# Patient Record
Sex: Male | Born: 1970 | Race: White | Hispanic: No | Marital: Married | Smoking: Former smoker
Health system: Southern US, Community
[De-identification: ages and names within clinical notes are randomized; demographics above are authoritative.]

## PROBLEM LIST (undated history)

## (undated) DIAGNOSIS — K219 Gastro-esophageal reflux disease without esophagitis: Secondary | ICD-10-CM

## (undated) DIAGNOSIS — K08109 Complete loss of teeth, unspecified cause, unspecified class: Secondary | ICD-10-CM

## (undated) DIAGNOSIS — J45909 Unspecified asthma, uncomplicated: Secondary | ICD-10-CM

## (undated) DIAGNOSIS — S68119A Complete traumatic metacarpophalangeal amputation of unspecified finger, initial encounter: Secondary | ICD-10-CM

## (undated) DIAGNOSIS — M5412 Radiculopathy, cervical region: Secondary | ICD-10-CM

## (undated) DIAGNOSIS — Z972 Presence of dental prosthetic device (complete) (partial): Secondary | ICD-10-CM

## (undated) DIAGNOSIS — M5020 Other cervical disc displacement, unspecified cervical region: Secondary | ICD-10-CM

## (undated) DIAGNOSIS — M1990 Unspecified osteoarthritis, unspecified site: Secondary | ICD-10-CM

## (undated) DIAGNOSIS — E119 Type 2 diabetes mellitus without complications: Secondary | ICD-10-CM

## (undated) HISTORY — DX: Complete loss of teeth, unspecified cause, unspecified class: K08.109

## (undated) HISTORY — DX: Gastro-esophageal reflux disease without esophagitis: K21.9

## (undated) HISTORY — PX: VASECTOMY: SHX75

## (undated) HISTORY — PX: TONSILLECTOMY: SUR1361

## (undated) HISTORY — PX: FINGER SURGERY: SHX640

---

## 2016-12-22 ENCOUNTER — Ambulatory Visit
Admission: EM | Admit: 2016-12-22 | Discharge: 2016-12-22 | Disposition: A | Discharge status: Home / Self Care | Payer: Self-pay | Attending: Emergency Medicine | Admitting: Emergency Medicine

## 2016-12-22 DIAGNOSIS — Z79899 Other long term (current) drug therapy: Secondary | ICD-10-CM | POA: Insufficient documentation

## 2016-12-22 DIAGNOSIS — S40262A Insect bite (nonvenomous) of left shoulder, initial encounter: Secondary | ICD-10-CM | POA: Insufficient documentation

## 2016-12-22 DIAGNOSIS — E876 Hypokalemia: Secondary | ICD-10-CM | POA: Insufficient documentation

## 2016-12-22 DIAGNOSIS — W57XXXA Bitten or stung by nonvenomous insect and other nonvenomous arthropods, initial encounter: Secondary | ICD-10-CM | POA: Insufficient documentation

## 2016-12-22 DIAGNOSIS — E1165 Type 2 diabetes mellitus with hyperglycemia: Secondary | ICD-10-CM | POA: Insufficient documentation

## 2016-12-22 DIAGNOSIS — J45909 Unspecified asthma, uncomplicated: Secondary | ICD-10-CM | POA: Insufficient documentation

## 2016-12-22 DIAGNOSIS — Z880 Allergy status to penicillin: Secondary | ICD-10-CM | POA: Insufficient documentation

## 2016-12-22 DIAGNOSIS — Z7984 Long term (current) use of oral hypoglycemic drugs: Secondary | ICD-10-CM | POA: Insufficient documentation

## 2016-12-22 DIAGNOSIS — R739 Hyperglycemia, unspecified: Secondary | ICD-10-CM

## 2016-12-22 DIAGNOSIS — R404 Transient alteration of awareness: Secondary | ICD-10-CM

## 2016-12-22 DIAGNOSIS — R748 Abnormal levels of other serum enzymes: Secondary | ICD-10-CM | POA: Insufficient documentation

## 2016-12-22 DIAGNOSIS — R4182 Altered mental status, unspecified: Secondary | ICD-10-CM | POA: Insufficient documentation

## 2016-12-22 HISTORY — DX: Unspecified asthma, uncomplicated: J45.909

## 2016-12-22 LAB — URINALYSIS, COMPLETE (UACMP) WITH MICROSCOPIC
BILIRUBIN URINE: NEGATIVE
Bacteria, UA: NONE SEEN
Ketones, ur: 40 mg/dL — AB
Leukocytes, UA: NEGATIVE
NITRITE: NEGATIVE
PROTEIN: 30 mg/dL — AB
Specific Gravity, Urine: 1.015 (ref 1.005–1.030)
pH: 5.5 (ref 5.0–8.0)

## 2016-12-22 LAB — CBC WITH DIFFERENTIAL/PLATELET
BASOS ABS: 0 10*3/uL (ref 0–0.1)
BASOS PCT: 1 %
EOS ABS: 0.1 10*3/uL (ref 0–0.7)
EOS PCT: 2 %
HCT: 42.3 % (ref 40.0–52.0)
HEMOGLOBIN: 13.7 g/dL (ref 13.0–18.0)
LYMPHS ABS: 2 10*3/uL (ref 1.0–3.6)
Lymphocytes Relative: 36 %
MCH: 25.6 pg — ABNORMAL LOW (ref 26.0–34.0)
MCHC: 32.3 g/dL (ref 32.0–36.0)
MCV: 79.3 fL — ABNORMAL LOW (ref 80.0–100.0)
Monocytes Absolute: 0.4 10*3/uL (ref 0.2–1.0)
Monocytes Relative: 7 %
NEUTROS PCT: 54 %
Neutro Abs: 3 10*3/uL (ref 1.4–6.5)
PLATELETS: 221 10*3/uL (ref 150–440)
RBC: 5.33 MIL/uL (ref 4.40–5.90)
RDW: 15.3 % — ABNORMAL HIGH (ref 11.5–14.5)
WBC: 5.5 10*3/uL (ref 3.8–10.6)

## 2016-12-22 LAB — COMPREHENSIVE METABOLIC PANEL
ALT: 103 U/L — AB (ref 17–63)
AST: 198 U/L — ABNORMAL HIGH (ref 15–41)
Albumin: 4.2 g/dL (ref 3.5–5.0)
Alkaline Phosphatase: 109 U/L (ref 38–126)
Anion gap: 13 (ref 5–15)
BILIRUBIN TOTAL: 1.1 mg/dL (ref 0.3–1.2)
BUN: 9 mg/dL (ref 6–20)
CALCIUM: 9.2 mg/dL (ref 8.9–10.3)
CHLORIDE: 96 mmol/L — AB (ref 101–111)
CO2: 22 mmol/L (ref 22–32)
CREATININE: 0.73 mg/dL (ref 0.61–1.24)
Glucose, Bld: 327 mg/dL — ABNORMAL HIGH (ref 65–99)
Potassium: 3 mmol/L — ABNORMAL LOW (ref 3.5–5.1)
Sodium: 131 mmol/L — ABNORMAL LOW (ref 135–145)
Total Protein: 7.9 g/dL (ref 6.5–8.1)

## 2016-12-22 LAB — GLUCOSE, CAPILLARY: GLUCOSE-CAPILLARY: 322 mg/dL — AB (ref 65–99)

## 2016-12-22 MED ORDER — DOXYCYCLINE HYCLATE 100 MG PO CAPS
100.0000 mg | ORAL_CAPSULE | Freq: Two times a day (BID) | ORAL | 0 refills | Status: DC
Start: 2016-12-22 — End: 2016-12-31

## 2016-12-22 MED ORDER — SODIUM CHLORIDE 0.9 % IV BOLUS (SEPSIS)
1000.0000 mL | Freq: Once | INTRAVENOUS | Status: AC
  Administered 2016-12-22: 1000 mL via INTRAVENOUS

## 2016-12-22 MED ORDER — METFORMIN HCL 500 MG PO TABS: 500.0000 mg | ORAL_TABLET | Freq: Every day | ORAL | 0 refills | Status: DC

## 2016-12-22 MED ORDER — POTASSIUM CHLORIDE CRYS ER 20 MEQ PO TBCR
40.0000 meq | EXTENDED_RELEASE_TABLET | Freq: Once | ORAL | Status: AC
  Administered 2016-12-22: 40 meq via ORAL

## 2016-12-22 NOTE — ED Provider Notes (Signed)
HPI  SUBJECTIVE:  Trevor Alvarado is a 46 y.o. male who was brought in by his wife for altered mental status described as "talking out of his head" and crying, being emotional. Patient had been working outside all day, and had not been drinking any fluids. He did eat breakfast this morning. Coworkers noted that the patient was not "acting himself" and so called his wife. She states that he was not acting like himself, but he was coherent and able to provide directions. He reports being dizzy, lightheaded, and generalized weakness. States that he had a shot of liquor earlier today due to stress, denies drinking more than that His wife gave him some Gatorade with improvement in symptoms. There are no aggravating factors. Patient denies presyncope, syncope, slurred speech, or leg weakness, facial droop, chest pain, shortness of breath, palpitations. No abdominal pain, urinary complaints. No polydipsia, polyuria, unintentional weight loss. He also reports removing an attached tick on his left shoulder one week ago. He is not sure if it was engorged. He now has a nontender, nonpainful not pruritic or burning rash in the area. No fevers, body aches, flulike symptoms, arthralgias, headaches, neck stiffness. Questionable mild photophobia. He has been putting antibiotic ointment on this. There are no other aggravating or alleviating factors. He has a past medical history of asthma, environmental allergies. No history of stroke mitral fibrillation, coronary disease, MI, diabetes, hypertension. Denies illicits or heavy alcohol use. Wife states that he has a shot or tw occasionally when under significant amount of stress, but states that he is not a regular heavy drinker. No regular medicines. Family history positive for a sister with a stroke at age 46. PMD: [facility].    Past Medical History:  Diagnosis Date  . Asthma     Past Surgical History:  Procedure Laterality Date  . TONSILLECTOMY      History  reviewed. No pertinent family history.  Social History  Substance Use Topics  . Smoking status: Never Smoker  . Smokeless tobacco: Current User    Types: Snuff  . Alcohol use No    No current facility-administered medications for this encounter.   Current Outpatient Prescriptions:  .  doxycycline (VIBRAMYCIN) 100 MG capsule, Take 1 capsule (100 mg total) by mouth 2 (two) times daily., Disp: 28 capsule, Rfl: 0 .  metFORMIN (GLUCOPHAGE) 500 MG tablet, Take 1 tablet (500 mg total) by mouth daily with breakfast. 1 tab po daily with the largest meal for the first week, then increase to 1 tab bid., Disp: 30 tablet, Rfl: 0  Allergies  Allergen Reactions  . Penicillins      ROS  As noted in HPI.   Physical Exam  BP (!) 156/111 (BP Location: Left Arm)   Pulse 88   Temp 97.5 F (36.4 C) (Oral)   Wt 250 lb (113.4 kg)   SpO2 100%   Orthostatic VS for the past 24 hrs:  BP- Lying Pulse- Lying BP- Sitting Pulse- Sitting BP- Standing at 0 minutes Pulse- Standing at 0 minutes  12/22/16 1530 143/73 73 155/79 75 162/89 79    Constitutional: Well developed, well nourished, no acute distress Eyes: PERRL, EOMI, conjunctiva normal bilaterally HENT: Normocephalic, atraumatic,mucus membranes moist Respiratory: Clear to auscultation bilaterally, no rales, no wheezing, no rhonchi Cardiovascular: Normal rate and rhythm, no murmurs, no gallops, no rubs GI: Soft, nondistended, normal bowel sounds, nontender, no rebound, no guarding Back: no CVAT Skin 9 x 13 nontender, blanchable annular rash on left shoulder. see picture  Musculoskeletal: Calves symmetric, nontender,  No edema, no tenderness, no deformities Neurologic: Alert & oriented x 3, CN II-XII intact, grip strength equal. No pronator drift for the upper or lower extremities, no motor deficits, sensation grossly intact Psychiatric: Speech and behavior appropriate   ED Course   Medications  sodium chloride 0.9 % bolus 1,000 mL  (0 mLs Intravenous Stopped 12/22/16 1610)  potassium chloride SA (K-DUR,KLOR-CON) CR tablet 40 mEq (40 mEq Oral Given 12/22/16 1525)    Orders Placed This Encounter  Procedures  . Glucose, capillary    Standing Status:   Standing    Number of Occurrences:   1  . Comprehensive metabolic panel    Standing Status:   Standing    Number of Occurrences:   1  . CBC with Differential    Standing Status:   Standing    Number of Occurrences:   1  . [place] spotted fvr abs pnl(IgG+IgM)    Standing Status:   Standing    Number of Occurrences:   1  . B. burgdorfi antibodies    Standing Status:   Standing    Number of Occurrences:   1  . Urinalysis, Complete w Microscopic    Standing Status:   Standing    Number of Occurrences:   1  . Hemoglobin A1c    Standing Status:   Standing    Number of Occurrences:   1  . Orthostatic vital signs    Standing Status:   Standing    Number of Occurrences:   1  . CBG monitoring, ED    Standing Status:   Standing    Number of Occurrences:   1  . ED EKG    Standing Status:   Standing    Number of Occurrences:   1    Order Specific Question:   Reason for Exam    Answer:   Weakness  . EKG 12-Lead    Standing Status:   Standing    Number of Occurrences:   1  . Insert peripheral IV    Standing Status:   Standing    Number of Occurrences:   1   Results for orders placed or performed during the hospital encounter of 12/22/16 (from the past 24 hour(s))  Glucose, capillary     Status: Abnormal   Collection Time: 12/22/16  2:40 PM  Result Value Ref Range   Glucose-Capillary 322 (H) 65 - 99 mg/dL  Comprehensive metabolic panel     Status: Abnormal   Collection Time: 12/22/16  2:56 PM  Result Value Ref Range   Sodium 131 (L) 135 - 145 mmol/L   Potassium 3.0 (L) 3.5 - 5.1 mmol/L   Chloride 96 (L) 101 - 111 mmol/L   CO2 22 22 - 32 mmol/L   Glucose, Bld 327 (H) 65 - 99 mg/dL   BUN 9 6 - 20 mg/dL   Creatinine, Ser 4.54 0.61 - 1.24 mg/dL   Calcium 9.2 8.9  - 09.8 mg/dL   Total Protein 7.9 6.5 - 8.1 g/dL   Albumin 4.2 3.5 - 5.0 g/dL   AST 119 (H) 15 - 41 U/L   ALT 103 (H) 17 - 63 U/L   Alkaline Phosphatase 109 38 - 126 U/L   Total Bilirubin 1.1 0.3 - 1.2 mg/dL   GFR calc non Af Amer >60 >60 mL/min   GFR calc Af Amer >60 >60 mL/min   Anion gap 13 5 - 15  CBC with Differential  Status: Abnormal   Collection Time: 12/22/16  2:56 PM  Result Value Ref Range   WBC 5.5 3.8 - 10.6 K/uL   RBC 5.33 4.40 - 5.90 MIL/uL   Hemoglobin 13.7 13.0 - 18.0 g/dL   HCT 16.1 09.6 - 04.5 %   MCV 79.3 (L) 80.0 - 100.0 fL   MCH 25.6 (L) 26.0 - 34.0 pg   MCHC 32.3 32.0 - 36.0 g/dL   RDW 40.9 (H) 81.1 - 91.4 %   Platelets 221 150 - 440 K/uL   Neutrophils Relative % 54 %   Neutro Abs 3.0 1.4 - 6.5 K/uL   Lymphocytes Relative 36 %   Lymphs Abs 2.0 1.0 - 3.6 K/uL   Monocytes Relative 7 %   Monocytes Absolute 0.4 0.2 - 1.0 K/uL   Eosinophils Relative 2 %   Eosinophils Absolute 0.1 0 - 0.7 K/uL   Basophils Relative 1 %   Basophils Absolute 0.0 0 - 0.1 K/uL  Urinalysis, Complete w Microscopic     Status: Abnormal   Collection Time: 12/22/16  2:59 PM  Result Value Ref Range   Color, Urine YELLOW YELLOW   APPearance CLEAR CLEAR   Specific Gravity, Urine 1.015 1.005 - 1.030   pH 5.5 5.0 - 8.0   Glucose, UA >1000 (A) NEGATIVE mg/dL   Hgb urine dipstick TRACE (A) NEGATIVE   Bilirubin Urine NEGATIVE NEGATIVE   Ketones, ur 40 (A) NEGATIVE mg/dL   Protein, ur 30 (A) NEGATIVE mg/dL   Nitrite NEGATIVE NEGATIVE   Leukocytes, UA NEGATIVE NEGATIVE   Squamous Epithelial / LPF 0-5 (A) NONE SEEN   WBC, UA 0-5 0 - 5 WBC/hpf   RBC / HPF 0-5 0 - 5 RBC/hpf   Bacteria, UA NONE SEEN NONE SEEN   No results found.  ED Clinical Impression  Hyperglycemia  Transient alteration of awareness  Tick bite, initial encounter  Hypokalemia  Elevated liver enzymes   ED Assessment/Plan  Evaluated patient, he was tearful, but lucid with clear speech. He had no focal  neurologic deficits. His fingerstick was 322. Advised patient that he needs to go to the ED but he repeatedly refused. Advised him that I cannot rule out all emergencies here, and have agreed to do a limited workup including EKG, orthostatics, CBC, CMP, UA. We'll start him on IV fluids as I suspect he is probably somewhat dehydrated as well. This could've been heat exhaustion, intoxication from excess alcohol, altered mental status from hyperglycemia, or even stress. Doubt stroke. He has agreed to go to the ED for any significantly abnormal labs. Will reevaluate.  Also sent off [facility] spotted fever/Lyme titers and a1C to get a definitive diagnosis of diabetes.  advised him to follow-up with his primary care physician in one week to have all of his labs rechecked. I will start him on metformin 500 mg daily to be increased to twice a day next week and this can be adjusted by his PMD when he sees them.   Patient was hypokalemic at 3.0, so gave him 40 mEq of potassium orally. He is not acidotic and has a normal anion gap. He is not in DKA. His liver enzymes are significantly elevated, he has ketones in his urine, trace hematuria, proteinuria and over thousand glucose. His CBC is normal.  EKG: Normal sinus rhythm, rate 64 for normal axis, normal intervals. No ST-T wave changes. No previous EKG for comparison.  On reevaluation, patient states that he feels significantly better. He is still lucid.  His wife states that he appears to be much better. Discussed the labs with him and his wife and recommended again that they go to the ED, and discussed with her that I will have him sign out AGAINST MEDICAL ADVICE if they decided not to go. However I will send him home with a prescription of doxycycline 100 mg by mouth twice a day for 2 weeks to cover any tick borne illness as I do not think that patient would seek additional medical help after this visit.  Discussed labs, imaging, MDM, plan and followup with  patient and family. Discussed sn/sx that should prompt return to the ED. Patient and spouse agree with plan.   Meds ordered this encounter  Medications  . sodium chloride 0.9 % bolus 1,000 mL  . potassium chloride SA (K-DUR,KLOR-CON) CR tablet 40 mEq  . doxycycline (VIBRAMYCIN) 100 MG capsule    Sig: Take 1 capsule (100 mg total) by mouth 2 (two) times daily.    Dispense:  28 capsule    Refill:  0  . metFORMIN (GLUCOPHAGE) 500 MG tablet    Sig: Take 1 tablet (500 mg total) by mouth daily with breakfast. 1 tab po daily with the largest meal for the first week, then increase to 1 tab bid.    Dispense:  30 tablet    Refill:  0    *This clinic note was created using Scientist, clinical (histocompatibility and immunogenetics). Therefore, there may be occasional mistakes despite careful proofreading.  ?  Domenick Gong, MD 12/22/16 1754

## 2016-12-22 NOTE — ED Triage Notes (Signed)
Patient complains of dizziness. Patient is crying and complaining about a tick bite. Patient wife states that she was called from his job and reports having lightheadedness.

## 2016-12-22 NOTE — Discharge Instructions (Signed)
Finish the doxycycline. We will contact you with any abnormal labs. Start the metformin 500 mg with the largest meal for the first week then increase it to 500 mg twice a day. Make sure you check in with your primary care physician to make sure that this is okay.

## 2016-12-23 LAB — HEMOGLOBIN A1C
Hgb A1c MFr Bld: 11.4 % — ABNORMAL HIGH (ref 4.8–5.6)
Mean Plasma Glucose: 280 mg/dL

## 2016-12-24 LAB — B. BURGDORFI ANTIBODIES

## 2016-12-25 LAB — ROCKY MTN SPOTTED FVR ABS PNL(IGG+IGM)
RMSF IGG: POSITIVE — AB
RMSF IGM: 0.14 {index} (ref 0.00–0.89)

## 2016-12-25 LAB — RMSF, IGG, IFA

## 2016-12-31 ENCOUNTER — Ambulatory Visit
Admission: EM | Admit: 2016-12-31 | Discharge: 2016-12-31 | Disposition: A | Discharge status: Home / Self Care | Payer: Self-pay | Attending: Emergency Medicine | Admitting: Emergency Medicine

## 2016-12-31 DIAGNOSIS — Z760 Encounter for issue of repeat prescription: Secondary | ICD-10-CM

## 2016-12-31 DIAGNOSIS — A770 Spotted fever due to Rickettsia rickettsii: Secondary | ICD-10-CM

## 2016-12-31 DIAGNOSIS — E119 Type 2 diabetes mellitus without complications: Secondary | ICD-10-CM

## 2016-12-31 MED ORDER — DOXYCYCLINE HYCLATE 100 MG PO CAPS: 100.0000 mg | ORAL_CAPSULE | Freq: Two times a day (BID) | ORAL | 0 refills | Status: AC

## 2016-12-31 MED ORDER — METFORMIN HCL 500 MG PO TABS: 500.0000 mg | ORAL_TABLET | Freq: Two times a day (BID) | ORAL | 0 refills | Status: DC

## 2016-12-31 NOTE — Discharge Instructions (Signed)
Here is a list of primary care providers who are taking new patients: ° °Dr. Deanna Jones, Dr. William Plonk °[address] °[address] °[place] [address] °[phone] ° °Dr. Jayce Cook °[address]  °[address]  °[place] [address]  °[phone] ° °[facility] [place] °[address] [place] [address]  °[place] [address]  °[phone] ° °[facility] °[address]  °[place], [place] [address] °[phone] ° °[facility] [place] °[address]  °[phone] °[place], [place] [address] ° °Go to [email or web address] to look up your medications. This will give you a list of where you can find your prescriptions at the most affordable prices. Or ask the pharmacist what the cash price is. This can be less expensive than what you would pay with insurance.   °

## 2016-12-31 NOTE — ED Provider Notes (Signed)
HPI  SUBJECTIVE:  Trevor Alvarado is a 46 y.o. male who presents for follow-up on RMSF labs, and the diagnosis of diabetes. He states that the rash and his shoulder started itching 2 days ago and is concerned that he may need to extend his doxycycline therapy past for 14 days that he was originally prescribed. He states that the pharmacy shorted him a day or 2 on the doxycycline. He denies fevers, bodyaches.  Patient also requesting more metformin. States that he has been calling around the primary care physicians and has not been able to get a timely appointment, states that the nearest appointment is 6-8 weeks away. He states that he has changed his diet, is taking daily walks, is losing weight, and his sugars are now ranging between 1:30 to 150. States that he feels significantly better since starting the metformin. PMD: None.  Past Medical History:  Diagnosis Date  . Asthma     Past Surgical History:  Procedure Laterality Date  . TONSILLECTOMY      No family history on file.  Social History  Substance Use Topics  . Smoking status: Never Smoker  . Smokeless tobacco: Current User    Types: Snuff  . Alcohol use No    No current facility-administered medications for this encounter.   Current Outpatient Prescriptions:  .  doxycycline (VIBRAMYCIN) 100 MG capsule, Take 1 capsule (100 mg total) by mouth 2 (two) times daily., Disp: 6 capsule, Rfl: 0 .  metFORMIN (GLUCOPHAGE) 500 MG tablet, Take 1 tablet (500 mg total) by mouth 2 (two) times daily., Disp: 60 tablet, Rfl: 0  Allergies  Allergen Reactions  . Penicillins      ROS  As noted in HPI.   Physical Exam  BP 127/78 (BP Location: Left Arm)   Pulse 84   Temp 98.5 F (36.9 C) (Oral)   Resp 18   Ht 6' (1.829 m)   Wt 255 lb (115.7 kg)   SpO2 97%   BMI 34.58 kg/m   Constitutional: Well developed, well nourished, no acute distress Eyes:  EOMI, conjunctiva normal bilaterally HENT: Normocephalic, atraumatic,mucus  membranes moist Respiratory: Normal inspiratory effort Cardiovascular: Normal rate GI: nondistended skin: Improving circular rash on left shoulder. See picture      Musculoskeletal: no deformities Neurologic: Alert & oriented x 3, no focal neuro deficits Psychiatric: Speech and behavior appropriate   ED Course   Medications - No data to display  No orders of the defined types were placed in this encounter.   No results found for this or any previous visit (from the past 24 hour(s)). No results found.  ED Clinical Impression  Type 2 diabetes mellitus without complication, without long-term current use of insulin (HCC)  [facility] spotted fever  Medication refill ED Assessment/Plan  Patient's hemoglobin A1c 11.4, discussed this with patient. Discussed that he has had diabetes for a long time. He states that he is feeling significantly better since he change his diet, is losing weight and is taking daily walks and is taking the metformin. States that he is writing on his blood sugars daily. Will write a month's prescription for metformin #60 with 1 refill.   Patient had positive RMSF titers, negative Lyme titers. Reassured patient and family member that RMSF does not need more than 14 days of treatment, particularly since he is asymptomatic. He also states that he was shorted "a day or 2" by the pharmacy- states he did not get the full 14 days, so we will  write a prescription for 3 days of doxycycline so that he can take it for 14 days total.  We'll provide a primary care referral list.  Discussed labs,  MDM, plan and followup with patient. Patient agrees with plan.   Meds ordered this encounter  Medications  . doxycycline (VIBRAMYCIN) 100 MG capsule    Sig: Take 1 capsule (100 mg total) by mouth 2 (two) times daily.    Dispense:  6 capsule    Refill:  0  . metFORMIN (GLUCOPHAGE) 500 MG tablet    Sig: Take 1 tablet (500 mg total) by mouth 2 (two) times daily.     Dispense:  60 tablet    Refill:  0    *This clinic note was created using Scientist, clinical (histocompatibility and immunogenetics)Dragon dictation software. Therefore, there may be occasional mistakes despite careful proofreading.  ?   Domenick GongMortenson, Janyra Barillas, MD 12/31/16 1851

## 2016-12-31 NOTE — ED Triage Notes (Signed)
Patient states that he is here for a follow up for RMSF. Patient states that his tick bite has began to itch more. Patient states that he has been unable to get in with primary care.

## 2017-02-08 ENCOUNTER — Emergency Department: Payer: Worker's Compensation

## 2017-02-08 ENCOUNTER — Encounter: Payer: Self-pay | Admitting: Emergency Medicine

## 2017-02-08 ENCOUNTER — Emergency Department
Admission: EM | Admit: 2017-02-08 | Discharge: 2017-02-08 | Disposition: A | Discharge status: Home / Self Care | Payer: Worker's Compensation | Attending: Emergency Medicine | Admitting: Emergency Medicine

## 2017-02-08 DIAGNOSIS — Z23 Encounter for immunization: Secondary | ICD-10-CM | POA: Diagnosis not present

## 2017-02-08 DIAGNOSIS — J45909 Unspecified asthma, uncomplicated: Secondary | ICD-10-CM | POA: Diagnosis not present

## 2017-02-08 DIAGNOSIS — F17220 Nicotine dependence, chewing tobacco, uncomplicated: Secondary | ICD-10-CM | POA: Diagnosis not present

## 2017-02-08 DIAGNOSIS — E119 Type 2 diabetes mellitus without complications: Secondary | ICD-10-CM | POA: Diagnosis not present

## 2017-02-08 DIAGNOSIS — Y929 Unspecified place or not applicable: Secondary | ICD-10-CM | POA: Diagnosis not present

## 2017-02-08 DIAGNOSIS — S68119A Complete traumatic metacarpophalangeal amputation of unspecified finger, initial encounter: Secondary | ICD-10-CM

## 2017-02-08 DIAGNOSIS — Y939 Activity, unspecified: Secondary | ICD-10-CM | POA: Insufficient documentation

## 2017-02-08 DIAGNOSIS — X58XXXA Exposure to other specified factors, initial encounter: Secondary | ICD-10-CM | POA: Insufficient documentation

## 2017-02-08 DIAGNOSIS — S68124A Partial traumatic metacarpophalangeal amputation of right ring finger, initial encounter: Secondary | ICD-10-CM | POA: Insufficient documentation

## 2017-02-08 DIAGNOSIS — S68129A Partial traumatic metacarpophalangeal amputation of unspecified finger, initial encounter: Secondary | ICD-10-CM

## 2017-02-08 DIAGNOSIS — Y990 Civilian activity done for income or pay: Secondary | ICD-10-CM | POA: Diagnosis not present

## 2017-02-08 DIAGNOSIS — S6991XA Unspecified injury of right wrist, hand and finger(s), initial encounter: Secondary | ICD-10-CM | POA: Diagnosis present

## 2017-02-08 HISTORY — DX: Type 2 diabetes mellitus without complications: E11.9

## 2017-02-08 MED ORDER — OXYCODONE-ACETAMINOPHEN 5-325 MG PO TABS
2.0000 | ORAL_TABLET | Freq: Once | ORAL | Status: AC
  Administered 2017-02-08: 2 via ORAL
  Filled 2017-02-08: qty 2

## 2017-02-08 MED ORDER — CEPHALEXIN 500 MG PO CAPS: 500.0000 mg | ORAL_CAPSULE | Freq: Four times a day (QID) | ORAL | 0 refills | Status: AC

## 2017-02-08 MED ORDER — OXYCODONE-ACETAMINOPHEN 7.5-325 MG PO TABS: 1.0000 | ORAL_TABLET | ORAL | 0 refills | Status: DC | PRN

## 2017-02-08 MED ORDER — CEFAZOLIN SODIUM 1 G IJ SOLR
1.0000 g | Freq: Once | INTRAMUSCULAR | Status: AC
  Administered 2017-02-08: 1 g via INTRAMUSCULAR
  Filled 2017-02-08: qty 10

## 2017-02-08 MED ORDER — TETANUS-DIPHTH-ACELL PERTUSSIS 5-2.5-18.5 LF-MCG/0.5 IM SUSP
0.5000 mL | Freq: Once | INTRAMUSCULAR | Status: AC
  Administered 2017-02-08: 0.5 mL via INTRAMUSCULAR
  Filled 2017-02-08: qty 0.5

## 2017-02-08 NOTE — ED Notes (Signed)
Friend brought piece of finger into room. Dr. Mayford KnifeWilliams at bedside to reattach finger.

## 2017-02-08 NOTE — ED Notes (Signed)
Spoke with Chalmers Guestoy Whitaker, manager at [employer], who states that no lab work required for [employer].  Office # [phone]

## 2017-02-08 NOTE — ED Triage Notes (Signed)
Pt to ED via POV, pt was at work and amputated the tip of his right ring finger. Bleeding is controlled at this time.

## 2017-02-08 NOTE — ED Provider Notes (Signed)
[facility] Emergency Department Provider Note       Time seen: ----------------------------------------- 2:04 PM on 02/08/2017 -----------------------------------------     I have reviewed the triage vital signs and the nursing notes.   HISTORY   Chief Complaint Finger Injury    HPI Trevor Alvarado is a [age] y.o. male who presents to the ED for an amputation of his right ring fingertip while he was at work today. Patient aggress pain is 10 out of 10 in his right ring finger. The amputation occurred approximately 10 minutes prior to arrival at work. He denies any other injuries or complaints.   Past Medical History:  Diagnosis Date  . Asthma   . Diabetes mellitus without complication (HCC)     There are no active problems to display for this patient.   Past Surgical History:  Procedure Laterality Date  . TONSILLECTOMY      Allergies Penicillins  Social History Social History  Substance Use Topics  . Smoking status: Never Smoker  . Smokeless tobacco: Current User    Types: Snuff  . Alcohol use No    Review of Systems Constitutional: Negative for fever. Cardiovascular: Negative for chest pain. Respiratory: Negative for shortness of breath. Musculoskeletal: Positive for pain in the right fourth digit Skin: Positive for right fourth fingertip amputation  ____________________________________________   PHYSICAL EXAM:  VITAL SIGNS: ED Triage Vitals [02/08/17 1402]  Enc Vitals Group     BP      Pulse      Resp      Temp      Temp src      SpO2      Weight      Height      Head Circumference      Peak Flow      Pain Score 10     Pain Loc      Pain Edu?      Excl. in GC?     Constitutional: Alert and oriented. Well appearing and in no distress. Eyes: Conjunctivae are normal. Normal extraocular movements. Musculoskeletal: Patient has undergone oblique amputation of the right fourth digit. Exposed bone is  appreciated Neurologic:  Normal speech and language. No gross focal neurologic deficits are appreciated.  Skin:  Bleeding from right fourth digit fingertip Psychiatric: Mood and affect are normal. Speech and behavior are normal.  ____________________________________________  ED COURSE:  Pertinent labs & imaging results that were available during my care of the patient were reviewed by me and considered in my medical decision making (see chart for details). Patient presents for fingertip amputation, we will assess with imaging as indicated.   Marland Kitchen..Laceration Repair Date/Time: 02/08/2017 3:01 PM Performed by: Emily FilbertWILLIAMS, JONATHAN E Authorized by: Daryel NovemberWILLIAMS, JONATHAN E   Consent:    Consent obtained:  Verbal   Consent given by:  Patient Anesthesia (see MAR for exact dosages):    Anesthesia method:  Local infiltration   Local anesthetic:  Lidocaine 1% WITH epi Laceration details:    Location:  Finger   Finger location:  R ring finger   Length (cm):  6   Depth (mm):  1.5 Repair type:    Repair type:  Complex Pre-procedure details:    Preparation:  Patient was prepped and draped in usual sterile fashion and imaging obtained to evaluate for foreign bodies Exploration:    Limited defect created (wound extended): no     Hemostasis achieved with:  Epinephrine   Wound exploration: entire depth of wound probed  and visualized     Contaminated: yes   Treatment:    Area cleansed with:  Betadine and saline   Amount of cleaning:  Extensive   Irrigation solution:  Sterile saline   Irrigation method:  Syringe   Visualized foreign bodies/material removed: yes     Debridement:  Minimal   Undermining:  None   Scar revision: no   Skin repair:    Repair method:  Sutures   Suture size:  5-0   Suture material:  Nylon   Number of sutures:  24 Approximation:    Approximation:  Close   Vermilion border: well-aligned   Post-procedure details:    Dressing:  Non-adherent dressing   Patient tolerance  of procedure:  Tolerated well, no immediate complications   ____________________________________________   RADIOLOGY Images were viewed by me  Right fourth digit IMPRESSION: Distal phalangeal tuft fracture with associated soft tissue injury. ____________________________________________  FINAL ASSESSMENT AND PLAN  Fingertip amputation, tuft fracture  Plan: Patient's  imaging was dictated above. Patient had presented for fingertip amputation and injury resulting. Wound was repaired as dictated above. He was also given a shot of antibiotics and we have placed a nonadhesive dressing on the wound. He will follow up on Monday with orthopedics for recheck. He'll continue home on pain medicine and antibiotics as directed.   Emily FilbertWilliams, Jonathan E, MD   Note: This note was generated in part or whole with voice recognition software. Voice recognition is usually quite accurate but there are transcription errors that can and very often do occur. I apologize for any typographical errors that were not detected and corrected.     Emily FilbertWilliams, Jonathan E, MD 02/08/17 1524

## 2017-02-08 NOTE — ED Notes (Signed)
Digital block being administered now by Dr. Mayford KnifeWilliams.

## 2017-02-08 NOTE — ED Notes (Signed)
Trevor Alvarado, EDT applying finger splint and dressing.

## 2017-02-09 ENCOUNTER — Encounter: Payer: Self-pay | Admitting: Emergency Medicine

## 2017-02-09 ENCOUNTER — Emergency Department: Payer: Self-pay

## 2017-02-09 ENCOUNTER — Emergency Department
Admission: EM | Admit: 2017-02-09 | Discharge: 2017-02-10 | Disposition: A | Discharge status: Home / Self Care | Payer: Self-pay | Attending: Emergency Medicine | Admitting: Emergency Medicine

## 2017-02-09 DIAGNOSIS — J45909 Unspecified asthma, uncomplicated: Secondary | ICD-10-CM | POA: Insufficient documentation

## 2017-02-09 DIAGNOSIS — G510 Bell's palsy: Secondary | ICD-10-CM | POA: Insufficient documentation

## 2017-02-09 DIAGNOSIS — E119 Type 2 diabetes mellitus without complications: Secondary | ICD-10-CM | POA: Insufficient documentation

## 2017-02-09 DIAGNOSIS — Z79899 Other long term (current) drug therapy: Secondary | ICD-10-CM | POA: Insufficient documentation

## 2017-02-09 DIAGNOSIS — Z7984 Long term (current) use of oral hypoglycemic drugs: Secondary | ICD-10-CM | POA: Insufficient documentation

## 2017-02-09 LAB — DIFFERENTIAL
BASOS PCT: 1 %
Basophils Absolute: 0 10*3/uL (ref 0–0.1)
EOS ABS: 0.2 10*3/uL (ref 0–0.7)
EOS PCT: 2 %
Lymphocytes Relative: 36 %
Lymphs Abs: 2.7 10*3/uL (ref 1.0–3.6)
MONO ABS: 0.5 10*3/uL (ref 0.2–1.0)
MONOS PCT: 7 %
NEUTROS ABS: 4.2 10*3/uL (ref 1.4–6.5)
Neutrophils Relative %: 54 %

## 2017-02-09 LAB — APTT: aPTT: 31 seconds (ref 24–36)

## 2017-02-09 LAB — CBC
HEMATOCRIT: 39.5 % — AB (ref 40.0–52.0)
Hemoglobin: 13.2 g/dL (ref 13.0–18.0)
MCH: 26.1 pg (ref 26.0–34.0)
MCHC: 33.3 g/dL (ref 32.0–36.0)
MCV: 78.5 fL — AB (ref 80.0–100.0)
PLATELETS: 249 10*3/uL (ref 150–440)
RBC: 5.04 MIL/uL (ref 4.40–5.90)
RDW: 15.4 % — ABNORMAL HIGH (ref 11.5–14.5)
WBC: 7.6 10*3/uL (ref 3.8–10.6)

## 2017-02-09 LAB — COMPREHENSIVE METABOLIC PANEL
ALT: 23 U/L (ref 17–63)
ANION GAP: 7 (ref 5–15)
AST: 27 U/L (ref 15–41)
Albumin: 4.3 g/dL (ref 3.5–5.0)
Alkaline Phosphatase: 81 U/L (ref 38–126)
BUN: 11 mg/dL (ref 6–20)
CHLORIDE: 102 mmol/L (ref 101–111)
CO2: 28 mmol/L (ref 22–32)
CREATININE: 0.93 mg/dL (ref 0.61–1.24)
Calcium: 9.3 mg/dL (ref 8.9–10.3)
Glucose, Bld: 148 mg/dL — ABNORMAL HIGH (ref 65–99)
Potassium: 3.8 mmol/L (ref 3.5–5.1)
Sodium: 137 mmol/L (ref 135–145)
Total Bilirubin: 1.2 mg/dL (ref 0.3–1.2)
Total Protein: 7.3 g/dL (ref 6.5–8.1)

## 2017-02-09 LAB — PROTIME-INR
INR: 0.95
PROTHROMBIN TIME: 12.7 s (ref 11.4–15.2)

## 2017-02-09 LAB — TROPONIN I

## 2017-02-09 NOTE — ED Notes (Signed)
Pt. Was in the ED yesterday for a partial amputated rt. Finger(4th finger).

## 2017-02-09 NOTE — ED Notes (Signed)
Pt. [facility].

## 2017-02-09 NOTE — ED Notes (Signed)
Per Dr. Lamont Snowballifenbark, order stroke order set, do not call code stroke, outside 24hr window

## 2017-02-09 NOTE — ED Notes (Signed)
Pt. Arrives to room #6, with facial droop on rt. Side.  Pt. Able to move all extremities.

## 2017-02-09 NOTE — ED Provider Notes (Signed)
[facility] Emergency Department Provider Note  ____________________________________________   First MD Initiated Contact with Patient 02/09/17 2300     (approximate)  I have reviewed the triage vital signs and the nursing notes.   HISTORY  Chief Complaint Facial Droop and Medication Reaction    HPI Trevor Alvarado is a 46 y.o. male whose wife brings him to the emergency Department with roughly 30 hours of right-sided facial tingling and progressive facial droop. Symptoms began slowly but evidently continually progressive. Over the course of this evening he has attempted to drink water and it dribbles out the right side of his mouth which prompted the visit today. History the patient was in our emergency departmentwith a partial amputation of the distal tuft on his right ring finger. He was given cephalexin and discharged home. He initially did well however his symptoms began yesterday evening. He denies headache.   Past Medical History:  Diagnosis Date  . Asthma   . Diabetes mellitus without complication (HCC)     There are no active problems to display for this patient.   Past Surgical History:  Procedure Laterality Date  . TONSILLECTOMY      Prior to Admission medications   Medication Sig Start Date End Date Taking? Authorizing Provider  cephALEXin (KEFLEX) 500 MG capsule Take 1 capsule (500 mg total) by mouth 4 (four) times daily. 02/08/17 02/18/17  Emily FilbertWilliams, Jonathan E, MD  doxycycline (VIBRAMYCIN) 50 MG capsule Take 2 capsules (100 mg total) by mouth 2 (two) times daily. 02/10/17 02/24/17  Merrily Brittleifenbark, Frieda Arnall, MD  HYDROcodone-acetaminophen (NORCO) 5-325 MG tablet Take 1 tablet by mouth every 6 (six) hours as needed for severe pain. 02/10/17   Merrily Brittleifenbark, Atlanta Pelto, MD  metFORMIN (GLUCOPHAGE) 500 MG tablet Take 1 tablet (500 mg total) by mouth 2 (two) times daily. 12/31/16 01/30/17  Domenick GongMortenson, Ashley, MD  predniSONE (DELTASONE) 10 MG tablet Take 6 tablets (60 mg  total) by mouth daily. 02/10/17 02/17/17  Merrily Brittleifenbark, Damen Windsor, MD    Allergies Penicillins  History reviewed. No pertinent family history.  Social History Social History  Substance Use Topics  . Smoking status: Never Smoker  . Smokeless tobacco: Current User    Types: Snuff  . Alcohol use No    Review of Systems Constitutional: No fever/chills Eyes: No visual changes. ENT: No sore throat. Cardiovascular: Denies chest pain. Respiratory: Denies shortness of breath. Gastrointestinal: No abdominal pain.  No nausea, no vomiting.  No diarrhea.  No constipation. Genitourinary: Negative for dysuria. Musculoskeletal: Negative for back pain. Skin: Negative for rash. Neurological: Negative for headaches, positive for right-sided facial droop   ____________________________________________   PHYSICAL EXAM:  VITAL SIGNS: ED Triage Vitals  Enc Vitals Group     BP 02/09/17 2254 (!) 151/85     Pulse Rate 02/09/17 2254 78     Resp 02/09/17 2254 18     Temp 02/09/17 2254 98.1 F (36.7 C)     Temp Source 02/09/17 2254 Oral     SpO2 02/09/17 2254 97 %     Weight 02/09/17 2254 242 lb (109.8 kg)     Height 02/09/17 2254 6' (1.829 m)     Head Circumference --      Peak Flow --      Pain Score 02/09/17 2253 8     Pain Loc --      Pain Edu? --      Excl. in GC? --     Constitutional: Alert and oriented 4 pleasant cooperative speaks  in full clear sentences Eyes: PERRL EOMI. Head: Atraumatic. Nose: No congestion/rhinnorhea. Mouth/Throat: No trismus Neck: No stridor.   Cardiovascular: Normal rate, regular rhythm. Grossly normal heart sounds.  Good peripheral circulation. Respiratory: Normal respiratory effort.  No retractions. Lungs CTAB and moving good air Gastrointestinal: Soft nontender Musculoskeletal: No lower extremity edema   Neurologic:  right facial droop at rest. Able to completely close his right eye although not as tight as the left. Able to completely for eyebrow  bilaterally. Decrease in facial creases on the right at rest Remainder of cranial nerves II through XII intact No pronator drift 5 out of 5 grips biceps triceps hip flexion and hip extension plantar flexion dorsiflexion Skin:  Skin is warm, dry and intact. No rash noted. Psychiatric: Mood and affect are normal. Speech and behavior are normal.    ____________________________________________   DIFFERENTIAL includes but not limited to  Stroke, Bell's palsy, Lyme disease ____________________________________________   LABS (all labs ordered are listed, but only abnormal results are displayed)  Labs Reviewed  CBC - Abnormal; Notable for the following:       Result Value   HCT 39.5 (*)    MCV 78.5 (*)    RDW 15.4 (*)    All other components within normal limits  COMPREHENSIVE METABOLIC PANEL - Abnormal; Notable for the following:    Glucose, Bld 148 (*)    All other components within normal limits  PROTIME-INR  APTT  DIFFERENTIAL  TROPONIN I  LYME DISEASE DNA BY PCR(BORRELIA BURG)    Labs unremarkable Lyme pending __________________________________________  EKG   ____________________________________________  RADIOLOGY  CT scan with no acute disease MRI brain with no acute disease ____________________________________________   PROCEDURES  Procedure(s) performed: no  Procedures  Critical Care performed: no  Observation: no ____________________________________________   INITIAL IMPRESSION / ASSESSMENT AND PLAN / ED COURSE  Pertinent labs & imaging results that were available during my care of the patient were reviewed by me and considered in my medical decision making (see chart for details).  On arrival the patient is well-appearing although with clear right facial droop. On cranial nerve examination he cannot completely shut his right eye although when furrowing his brow he is able to do so bilaterally. At rest he has a decrease in the amount of forehead  creases on the right side. Unclear if this represents a central versus a peripheral seventh nerve palsy. He is well outside the window of any TPA or interventional radiology treatment for potential stroke so I will not activate a code stroke now. Head CT at first but if it is negative I will progress to MRI to fully evaluate. He has had recent tick bites and nearly 2 months ago was treated with 14 days of doxycycline for [facility] spotted fever. At that time he had negative Lyme titers.    ___----------------------------------------- 11:28 PM on 02/09/2017 -----------------------------------------  Fortunately the patient's head CT is negative. I'm still concerned that he might be having a central process so MRI is pending. I appreciate that he had negative Lyme titers previously however I will resend it now and if his MRI is negative we will treat him with another course of doxycycline. _________________________________________  ----------------------------------------- 1:32 AM on 02/10/2017 -----------------------------------------  Fortunately the patient's MRI is negative for acute infarct. At this point I think it is reasonable to treat him with 14 days of doxycycline as well as 7 days of prednisone. The patient reports severe pain in his partially amputated right  finger and that the Percocet he was prescribed at home has not helped his pain. He understands to flush the Percocet and I will prescribe him hydrocodone instead.  FINAL CLINICAL IMPRESSION(S) / ED DIAGNOSES  Final diagnoses:  Bell's palsy      NEW MEDICATIONS STARTED DURING THIS VISIT:  Discharge Medication List as of 02/10/2017  2:20 AM       Note:  This document was prepared using Dragon voice recognition software and may include unintentional dictation errors.     Merrily Brittleifenbark, Eura Radabaugh, MD 02/10/17 [phone]

## 2017-02-09 NOTE — ED Triage Notes (Signed)
Pt to triage via WC, pt reports was d/c from ED yesterday, had right finger sutured and splinted, had percocet, left hospital around 1700 02/09/17.  Just after, pt reports not feeling right, report started taking antibiotics, developed right eye swelling.  Pt with facial droop on right side in triage, wife report pt difficulty drink, dribbling through right side, pt w/ diminished sensation in right arm, reports difficulty with balance.

## 2017-02-10 ENCOUNTER — Emergency Department: Payer: Self-pay

## 2017-02-10 MED ORDER — PREDNISONE 10 MG PO TABS: 60.0000 mg | ORAL_TABLET | Freq: Every day | ORAL | 0 refills | Status: AC

## 2017-02-10 MED ORDER — PREDNISONE 20 MG PO TABS
60.0000 mg | ORAL_TABLET | Freq: Once | ORAL | Status: AC
  Administered 2017-02-10: 60 mg via ORAL
  Filled 2017-02-10: qty 3

## 2017-02-10 MED ORDER — PREDNISONE 10 MG PO TABS: 60.0000 mg | ORAL_TABLET | Freq: Every day | ORAL | 0 refills | Status: DC

## 2017-02-10 MED ORDER — DOXYCYCLINE HYCLATE 50 MG PO CAPS: 100.0000 mg | ORAL_CAPSULE | Freq: Two times a day (BID) | ORAL | 0 refills | Status: DC

## 2017-02-10 MED ORDER — HYDROCODONE-ACETAMINOPHEN 5-325 MG PO TABS: 1.0000 | ORAL_TABLET | Freq: Four times a day (QID) | ORAL | 0 refills | Status: DC | PRN

## 2017-02-10 MED ORDER — DOXYCYCLINE HYCLATE 50 MG PO CAPS: 100.0000 mg | ORAL_CAPSULE | Freq: Two times a day (BID) | ORAL | 0 refills | Status: AC

## 2017-02-10 MED ORDER — HYDROCODONE-ACETAMINOPHEN 5-325 MG PO TABS
2.0000 | ORAL_TABLET | Freq: Once | ORAL | Status: AC
Start: 2017-02-10 — End: 2017-02-10
  Administered 2017-02-10: 2 via ORAL
  Filled 2017-02-10: qty 2

## 2017-02-10 NOTE — Discharge Instructions (Signed)
Fortunately today your MRI was reassuring. There is always a chance to your Bell's palsy was caused by Lyme disease so please take your doxycycline until I call you with the results of your Lyme test. Follow-up with your primary care physician as needed and return to the emergency department for any concerns.  Please stop taking your Percocet as it does not seem to be working for your pain and take the Norco I have prescribed instead.  It was a pleasure to take care of you today, and thank you for coming to our emergency department.  If you have any questions or concerns before leaving please ask the nurse to grab me and I'm more than happy to go through your aftercare instructions again.  If you were prescribed any opioid pain medication today such as Norco, Vicodin, Percocet, morphine, hydrocodone, or oxycodone please make sure you do not drive when you are taking this medication as it can alter your ability to drive safely.  If you have any concerns once you are home that you are not improving or are in fact getting worse before you can make it to your follow-up appointment, please do not hesitate to call [phone] and come back for further evaluation.  Merrily BrittleNeil Jayquon Theiler, MD  Results for orders placed or performed during the hospital encounter of 02/09/17  Protime-INR  Result Value Ref Range   Prothrombin Time 12.7 11.4 - 15.2 seconds   INR 0.95   APTT  Result Value Ref Range   aPTT 31 24 - 36 seconds  CBC  Result Value Ref Range   WBC 7.6 3.8 - 10.6 K/uL   RBC 5.04 4.40 - 5.90 MIL/uL   Hemoglobin 13.2 13.0 - 18.0 g/dL   HCT 96.039.5 (L) 45.440.0 - 09.852.0 %   MCV 78.5 (L) 80.0 - 100.0 fL   MCH 26.1 26.0 - 34.0 pg   MCHC 33.3 32.0 - 36.0 g/dL   RDW 11.915.4 (H) 14.711.5 - 82.914.5 %   Platelets 249 150 - 440 K/uL  Differential  Result Value Ref Range   Neutrophils Relative % 54 %   Neutro Abs 4.2 1.4 - 6.5 K/uL   Lymphocytes Relative 36 %   Lymphs Abs 2.7 1.0 - 3.6 K/uL   Monocytes Relative 7 %   Monocytes  Absolute 0.5 0.2 - 1.0 K/uL   Eosinophils Relative 2 %   Eosinophils Absolute 0.2 0 - 0.7 K/uL   Basophils Relative 1 %   Basophils Absolute 0.0 0 - 0.1 K/uL  Comprehensive metabolic panel  Result Value Ref Range   Sodium 137 135 - 145 mmol/L   Potassium 3.8 3.5 - 5.1 mmol/L   Chloride 102 101 - 111 mmol/L   CO2 28 22 - 32 mmol/L   Glucose, Bld 148 (H) 65 - 99 mg/dL   BUN 11 6 - 20 mg/dL   Creatinine, Ser 5.620.93 0.61 - 1.24 mg/dL   Calcium 9.3 8.9 - 13.010.3 mg/dL   Total Protein 7.3 6.5 - 8.1 g/dL   Albumin 4.3 3.5 - 5.0 g/dL   AST 27 15 - 41 U/L   ALT 23 17 - 63 U/L   Alkaline Phosphatase 81 38 - 126 U/L   Total Bilirubin 1.2 0.3 - 1.2 mg/dL   GFR calc non Af Amer >60 >60 mL/min   GFR calc Af Amer >60 >60 mL/min   Anion gap 7 5 - 15  Troponin I  Result Value Ref Range   Troponin I <0.03 <0.03 ng/mL  Ct Head Wo Contrast  Result Date: 02/09/2017 CLINICAL DATA:  Right facial weakness since this morning. EXAM: CT HEAD WITHOUT CONTRAST TECHNIQUE: Contiguous axial images were obtained from the base of the skull through the vertex without intravenous contrast. COMPARISON:  None. FINDINGS: Brain: No evidence of acute infarction, hemorrhage, hydrocephalus, extra-axial collection or mass lesion/mass effect. Vascular: No hyperdense vessel or unexpected calcification. Skull: Normal. Negative for fracture or focal lesion. Sinuses/Orbits: No acute finding. Other: None. IMPRESSION: No acute intracranial abnormalities. Electronically Signed   By: Burman NievesWilliam  Stevens M.D.   On: 02/09/2017 23:20   Mr Brain Wo Contrast (neuro Protocol)  Result Date: 02/10/2017 CLINICAL DATA:  46 y/o M; right facial droop concerning for subacute cerebrovascular accident. EXAM: MRI HEAD WITHOUT CONTRAST TECHNIQUE: Multiplanar, multiecho pulse sequences of the brain and surrounding structures were obtained without intravenous contrast. COMPARISON:  02/09/2017 CT head. FINDINGS: Brain: No acute infarction, hemorrhage,  hydrocephalus, extra-axial collection or mass lesion. Single punctate focus of T2 FLAIR hyperintense signal abnormality and right frontal centrum semiovale of unlikely clinical significance. Vascular: Normal flow voids. Skull and upper cervical spine: Normal marrow signal. Sinuses/Orbits: Negative. Other: None. IMPRESSION: No acute intracranial abnormality identified. Unremarkable MRI of the brain. Electronically Signed   By: Mitzi HansenLance  Furusawa-Stratton M.D.   On: 02/10/2017 00:56   Dg Finger Ring Right  Result Date: 02/08/2017 CLINICAL DATA:  Recent fourth digit amputation, initial encounter EXAM: RIGHT RING FINGER 2+V COMPARISON:  None. FINDINGS: Soft tissue abnormality is noted consistent with the recent history. Comminuted fracture of the distal phalangeal tuft is seen with some displacement of fracture fragments. IMPRESSION: Distal phalangeal tuft fracture with associated soft tissue injury. Electronically Signed   By: Alcide CleverMark  Lukens M.D.   On: 02/08/2017 15:15

## 2017-02-10 NOTE — ED Notes (Signed)
Pt. Has returned from MRI 

## 2017-02-10 NOTE — ED Notes (Signed)
Pt. Going home with wife. 

## 2017-02-10 NOTE — ED Notes (Signed)
Patient transported to MRI 

## 2017-02-12 LAB — LYME DISEASE DNA BY PCR(BORRELIA BURG): Lyme Disease(B.burgdorferi)PCR: NEGATIVE

## 2017-02-19 ENCOUNTER — Telehealth: Payer: Self-pay | Admitting: Emergency Medicine

## 2017-02-19 DIAGNOSIS — E119 Type 2 diabetes mellitus without complications: Secondary | ICD-10-CM | POA: Insufficient documentation

## 2017-02-19 NOTE — Telephone Encounter (Signed)
Called patient back.  He wanted Lyme result.  I gave him result.  He says his facial paralysis is improving and that he has stopped the cephalexin.

## 2017-05-10 DIAGNOSIS — S68119A Complete traumatic metacarpophalangeal amputation of unspecified finger, initial encounter: Secondary | ICD-10-CM | POA: Insufficient documentation

## 2018-09-18 IMAGING — DX DG FINGER RING 2+V*R*
3 series · 3 of 3 positions shown · non-contrast
Comparison: None.

CLINICAL DATA: Recent fourth digit amputation, initial encounter

EXAM:
RIGHT RING FINGER 2+V

[finger ap]
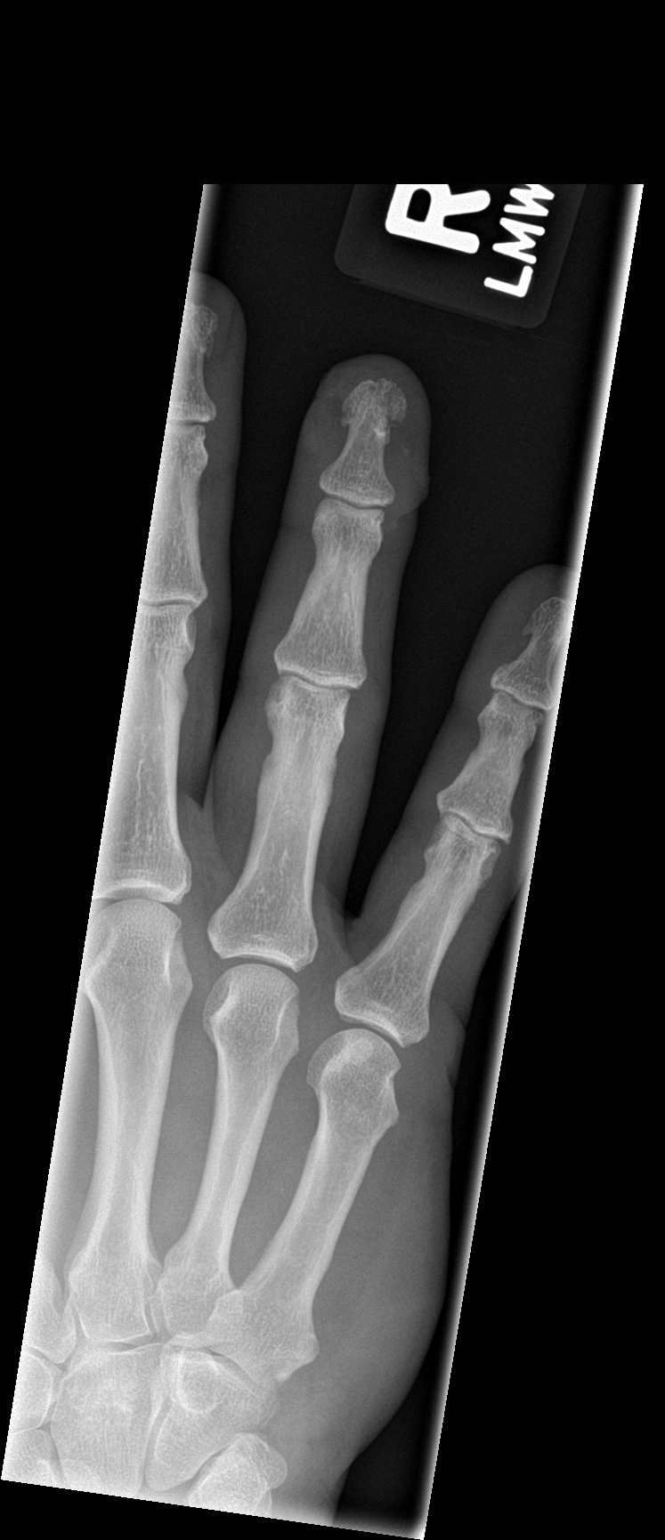

[finger obl]
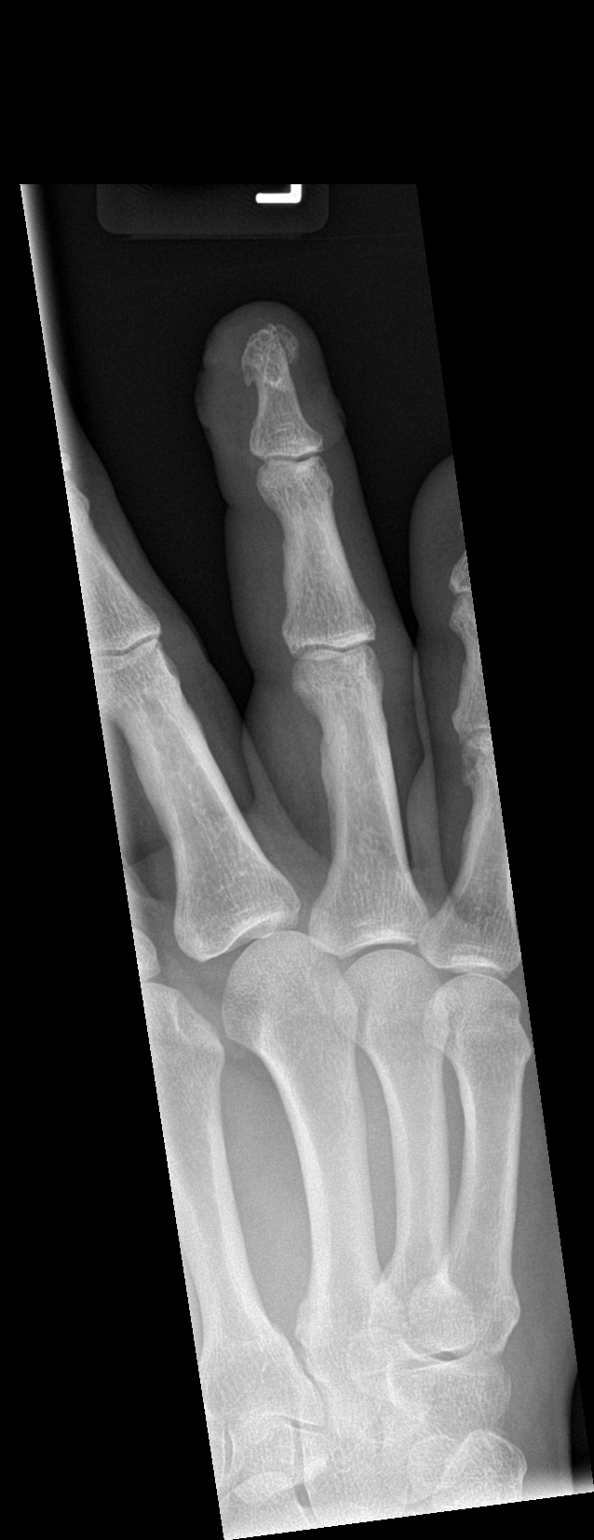

[finger lat]
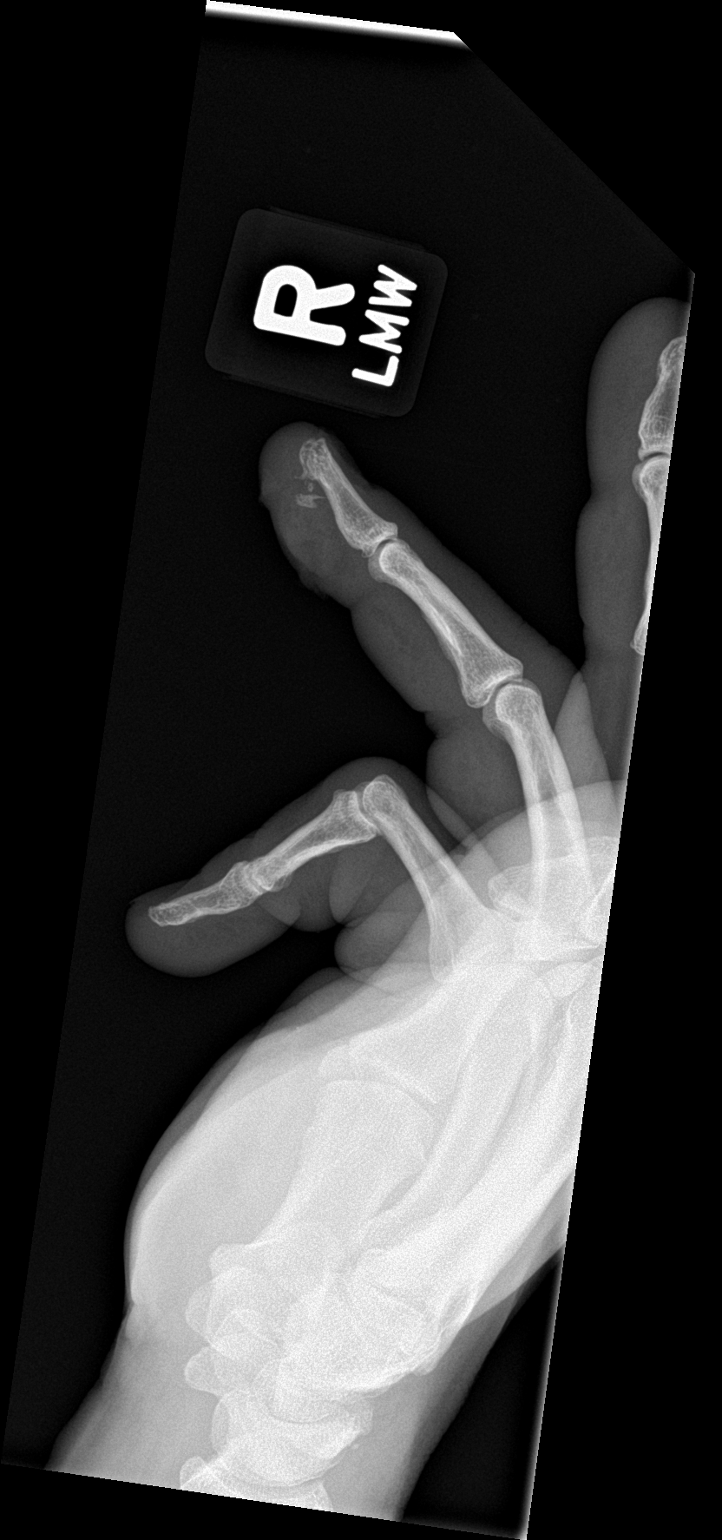

[3 of 3 positions shown; findings below may reference images not displayed]

FINDINGS: Soft tissue abnormality is noted consistent with the recent history.
Comminuted fracture of the distal phalangeal tuft is seen with some
displacement of fracture fragments.
IMPRESSION: Distal phalangeal tuft fracture with associated soft tissue injury.

## 2018-09-20 IMAGING — MR MR HEAD W/O CM
10 series · 48 of 48 positions shown · non-contrast
Comparison: 02/09/2017 CT head.

CLINICAL DATA: 46 y/o M; right facial droop concerning for subacute
cerebrovascular accident.

EXAM:
MRI HEAD WITHOUT CONTRAST
TECHNIQUE: Multiplanar, multiecho pulse sequences of the brain and surrounding
structures were obtained without intravenous contrast.

[Series 2: T1 · sagittal · 5.0mm · 0.45mm/px · 3 of 29 slices shown (1 of 2)]
[im 1/29]
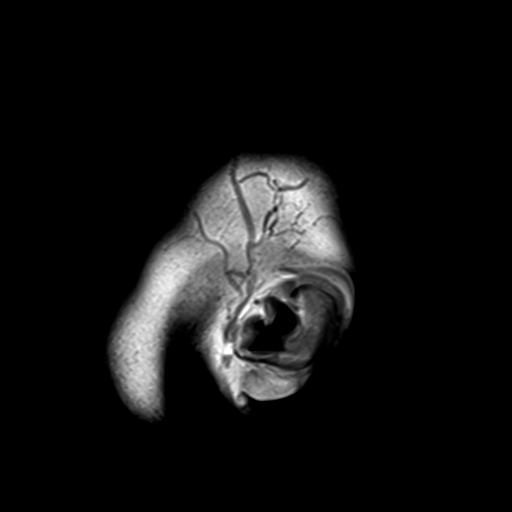
[im 15/29]
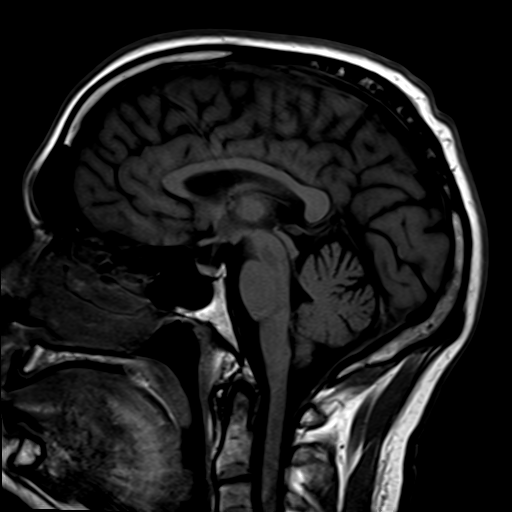
[im 29/29]
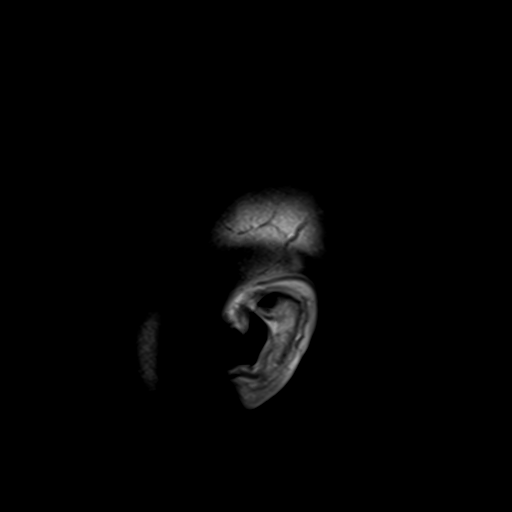

[Series 4: DWI · axial · 3.0mm · 1.80mm/px · z∈[-54,+110]mm · 5 of 56 slices shown (1 of 2)]
[im 1/56]
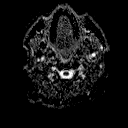
[im 14/56]
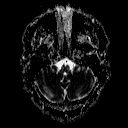
[im 28/56]
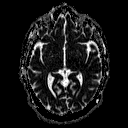
[im 42/56]
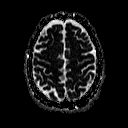
[im 56/56]
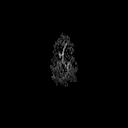

[Series 6: DWI · coronal · 3.0mm · 1.80mm/px · 4 of 51 slices shown (2 of 2)]
[im 1/51]
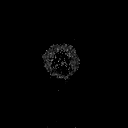
[im 17/51]
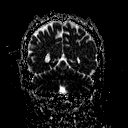
[im 34/51]
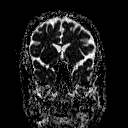
[im 51/51]
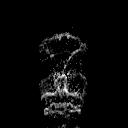

[Series 7: T2 · axial · 5.0mm · 0.60mm/px · z∈[-50,+111]mm · 2 of 26 slices shown (1 of 3)]
[im 1/26]
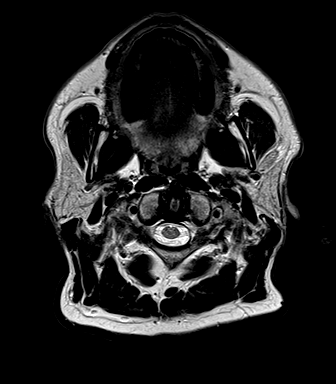
[im 26/26]
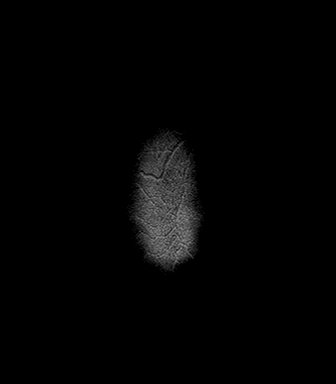

[Series 8: FLAIR · axial · 3.0mm · 0.45mm/px · z∈[-47,+108]mm · 5 of 53 slices shown]
[im 1/53]
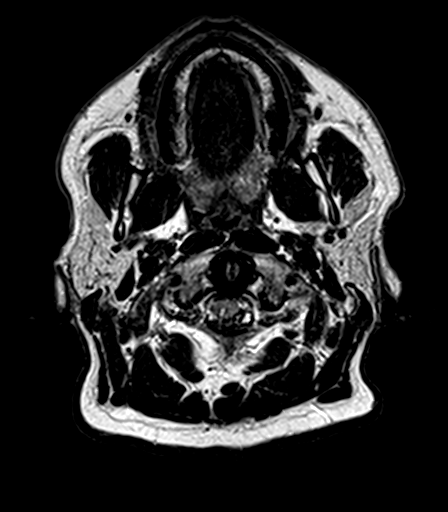
[im 14/53]
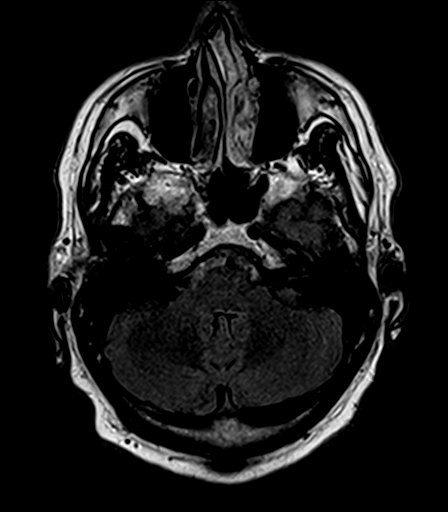
[im 27/53]
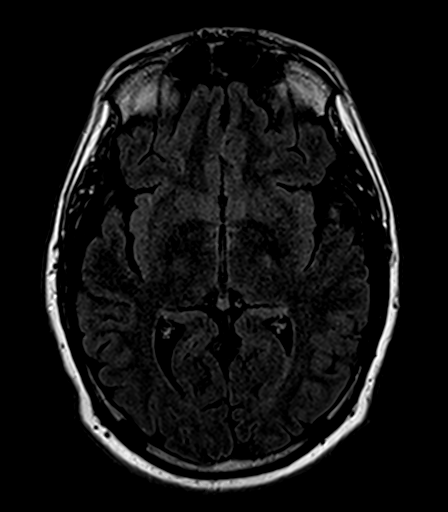
[im 40/53]
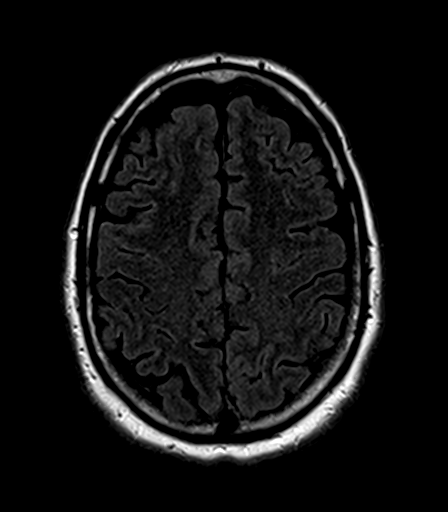
[im 53/53]
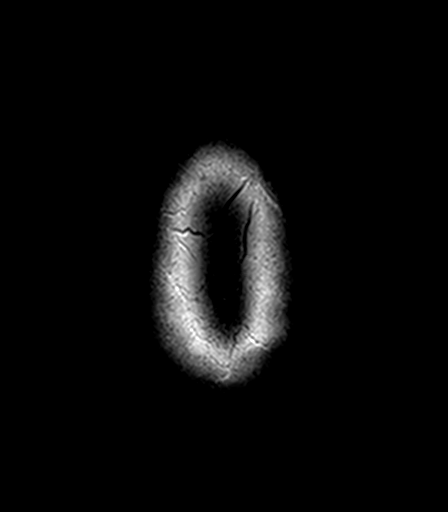

[Series 9: T2 · axial · 5.0mm · 0.45mm/px · z∈[-50,+111]mm · 2 of 26 slices shown (2 of 3)]
[im 1/26]
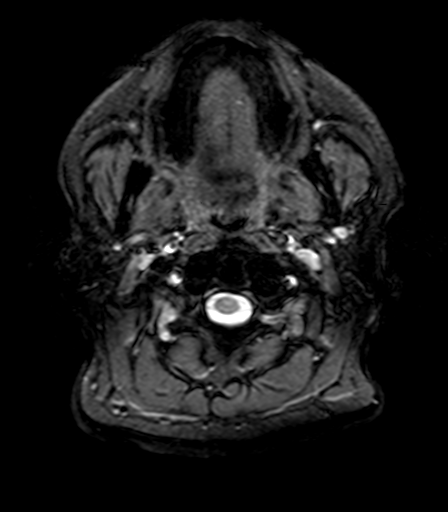
[im 26/26]
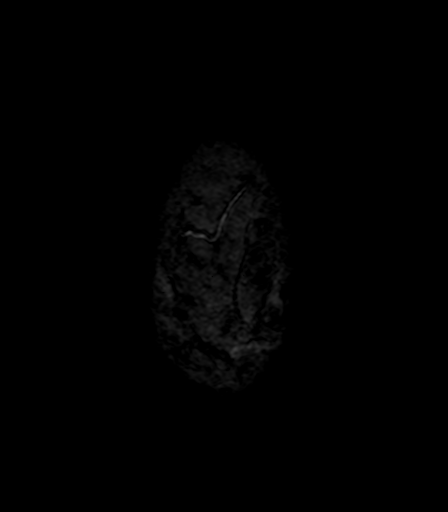

[Series 10: T1 · axial · 1.0mm · 1.00mm/px · z∈[-54,+119]mm · 15 of 176 slices shown (2 of 2)]
[im 1/176]
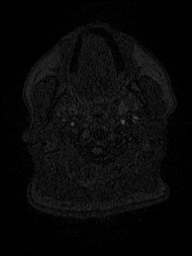
[im 13/176]
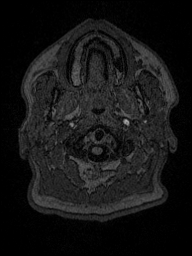
[im 26/176]
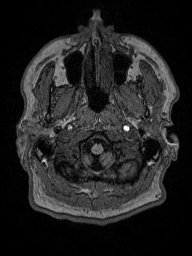
[im 38/176]
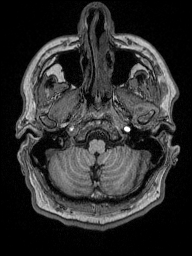
[im 51/176]
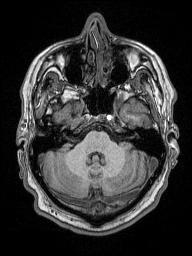
[im 63/176]
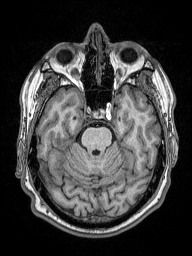
[im 76/176]
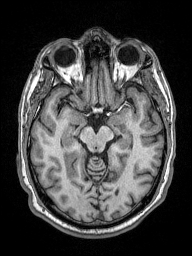
[im 88/176]
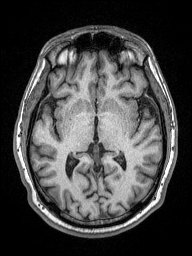
[im 101/176]
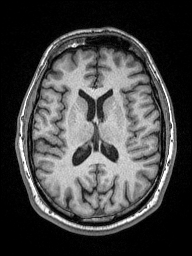
[im 113/176]
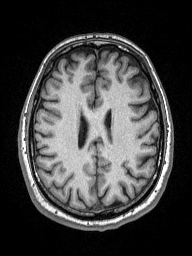
[im 126/176]
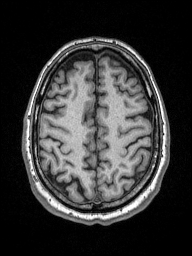
[im 138/176]
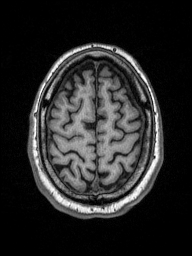
[im 151/176]
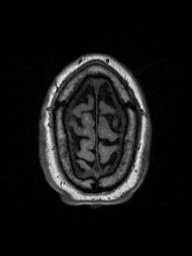
[im 163/176]
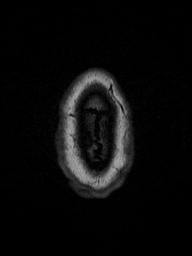
[im 176/176]
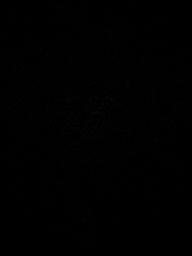

[Series 11: T2 · coronal · 5.0mm · 0.49mm/px · 3 of 31 slices shown (3 of 3)]
[im 1/31]
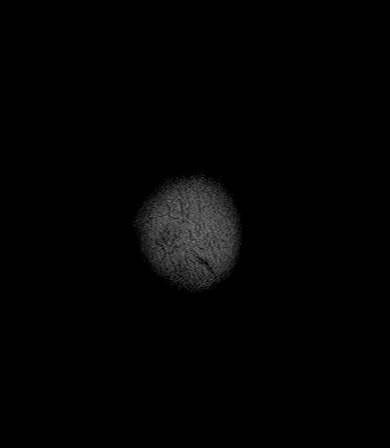
[im 16/31]
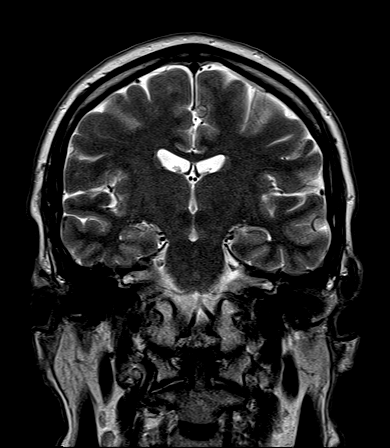
[im 31/31]
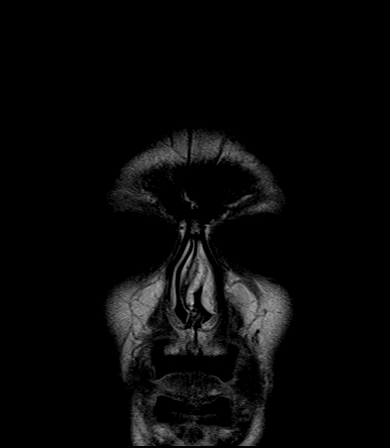

[Series 100: cor (id) · coronal · 3.0mm · 1.80mm/px · 4 of 50 slices shown]
[im 1/50]
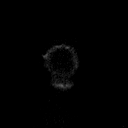
[im 17/50]
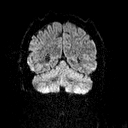
[im 33/50]
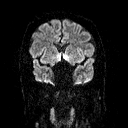
[im 50/50]
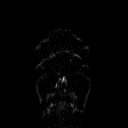

[Series 101: ax (id) · axial · 3.0mm · 1.80mm/px · z∈[-54,+110]mm · 5 of 55 slices shown]
[im 1/55]
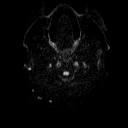
[im 14/55]
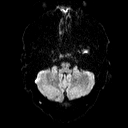
[im 28/55]
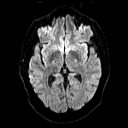
[im 41/55]
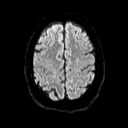
[im 55/55]
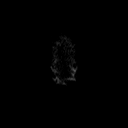

[48 of 48 positions shown; findings below may reference images not displayed]

FINDINGS: Brain: No acute infarction, hemorrhage, hydrocephalus, extra-axial
collection or mass lesion. Single punctate focus of T2 FLAIR
hyperintense signal abnormality and right frontal centrum semiovale
of unlikely clinical significance.

Vascular: Normal flow voids.

Skull and upper cervical spine: Normal marrow signal.

Sinuses/Orbits: Negative.

Other: None.
IMPRESSION: No acute intracranial abnormality identified. Unremarkable MRI of
the brain.

By: Chayanne Scotto M.D.

## 2023-03-20 ENCOUNTER — Inpatient Hospital Stay
Admission: RE | Admit: 2023-03-20 | Discharge: 2023-03-20 | Disposition: A | Discharge status: Home / Self Care | Payer: Self-pay | Source: Ambulatory Visit | Attending: Orthopedic Surgery | Admitting: Orthopedic Surgery

## 2023-03-20 ENCOUNTER — Other Ambulatory Visit: Payer: Self-pay

## 2023-03-20 DIAGNOSIS — Z049 Encounter for examination and observation for unspecified reason: Secondary | ICD-10-CM

## 2023-03-21 ENCOUNTER — Inpatient Hospital Stay
Admission: RE | Admit: 2023-03-21 | Discharge: 2023-03-21 | Disposition: A | Discharge status: Home / Self Care | Payer: Self-pay | Source: Ambulatory Visit | Attending: Orthopedic Surgery | Admitting: Orthopedic Surgery

## 2023-03-21 DIAGNOSIS — Z049 Encounter for examination and observation for unspecified reason: Secondary | ICD-10-CM

## 2023-04-03 DIAGNOSIS — K219 Gastro-esophageal reflux disease without esophagitis: Secondary | ICD-10-CM | POA: Insufficient documentation

## 2023-04-03 DIAGNOSIS — M1990 Unspecified osteoarthritis, unspecified site: Secondary | ICD-10-CM | POA: Insufficient documentation

## 2023-04-03 NOTE — Progress Notes (Unsigned)
Referring Physician:  Emily Filbert, MD [address] [place],  [place] [address]  Primary Physician:  No primary care provider on file.  History of Present Illness: 04/03/2023 Trevor Alvarado is here today with a chief complaint of neck and left arm pain.  He works as both a Hotel manager.  He presents with severe neck pain that radiates down his arm and causes numbness and tingling in his fingers. The pain has been ongoing for a while and has been affecting his ability to perform daily activities and work. He describes the pain as sharp and shooting, which is exacerbated by certain movements and activities. He has tried to manage the pain with exercises given by a chiropractor and by adjusting his sleeping position, which has resulted in improved sleep duration from four to five hours to seven to eight hours per night. Despite these efforts, the pain persists and has led to significant weakness in his arm, particularly noticeable when lifting weights, a decrease from being able to lift 20-25 pounds to only 5 pounds. He has also noticed a loss of muscle mass in his triceps. He has recently quit using nicotine.  He has been having pain for at least 6 months.  Bowel/Bladder Dysfunction: none  Conservative measures: seen a chiropractor: 6 months ago  Physical therapy:  has not participated in Multimodal medical therapy including regular antiinflammatories:  tramadol, prednisone, flexeril  Injections:  has not received epidural steroid injections  Past Surgery: no previous spinal surgery  Trevor Alvarado has no symptoms of cervical myelopathy.  The symptoms are causing a significant impact on the patient's life.   I have utilized the care everywhere function in epic to review the outside records available from external health systems.  Review of Systems:  A 10 point review of systems is negative, except for the pertinent positives and negatives detailed in the HPI.  Past  Medical History: Past Medical History:  Diagnosis Date   Asthma    Diabetes mellitus without complication (HCC)     Past Surgical History: Past Surgical History:  Procedure Laterality Date   TONSILLECTOMY      Allergies: Allergies as of 04/04/2023 - Review Complete 02/09/2017  Allergen Reaction Noted   Cephalexin Other (See Comments) 02/19/2017   Penicillins  12/22/2016    Medications:  Current Outpatient Medications:    HYDROcodone-acetaminophen (NORCO) 5-325 MG tablet, Take 1 tablet by mouth every 6 (six) hours as needed for severe pain., Disp: 15 tablet, Rfl: 0   metFORMIN (GLUCOPHAGE) 500 MG tablet, Take 1 tablet (500 mg total) by mouth 2 (two) times daily., Disp: 60 tablet, Rfl: 0  Social History: Social History   Tobacco Use   Smoking status: Never   Smokeless tobacco: Current    Types: Snuff  Substance Use Topics   Alcohol use: No   Drug use: No    Family Medical History: No family history on file.  Physical Examination: There were no vitals filed for this visit.  General: Patient is in no apparent distress. Attention to examination is appropriate.  Neck:   Supple.  Full range of motion. Bending to R makes his pain better.  Respiratory: Patient is breathing without any difficulty.   NEUROLOGICAL:     Awake, alert, oriented to person, place, and time.  Speech is clear and fluent.   Cranial Nerves: Pupils equal round and reactive to light.  Facial tone is symmetric.  Facial sensation is symmetric. Shoulder shrug is symmetric.  Tongue protrusion is midline.  There is no pronator drift.  Strength: Side Biceps Triceps Deltoid Interossei Grip Wrist Ext. Wrist Flex.  R 5 5 5 5 5 5 5   L 5 4- 5 5 5 5 5    Side Iliopsoas Quads Hamstring PF DF EHL  R 5 5 5 5 5 5   L 5 5 5 5 5 5    Reflexes are 1+ and symmetric at the biceps, triceps, brachioradialis, patella and achilles.   Hoffman's is absent.   Bilateral upper and lower extremity sensation is intact to  light touch.    No evidence of dysmetria noted.  Gait is normal.     Medical Decision Making  Imaging: MRI C spine 03/09/2023 Moderate-sized broad-based central and left paracentral and medial foraminal disc protrusion at C6-7 with significant mass effect on the left C7 nerve root in the medial neuroforamen.  Shallow right paracentral disc protrusion at C5-6 with mild mass effect on the right side of the thecal sac and mild right medial foraminal encroachment.  I have personally reviewed the images and agree with the above interpretation.  Assessment and Plan: [name] is a pleasant 52 y.o. 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  He has tried and failed conservative management.  He has persistent objective weakness.  At this point, no further conservative management is indicated.  Have recommended surgical intervention.  We discussed the options including anterior cervical disc arthroplasty, anterior cervical discectomy and fusion, or posterior discectomy.  I recommended a posterior discectomy.  We discussed the advantages and disadvantages of these approaches.  Cervical Radiculopathy Chronic left-sided neck pain with radiation down the arm, numbness in fingers, and significant weakness in the left arm. MRI shows disc herniation at C6-7 level. Symptoms are consistent with this level of herniation. Failed conservative management with chiropractic care and physical therapy. -Plan for posterior cervical discectomy to address the disc herniation at C6-7. -Post-operative restrictions include a 10-pound lifting limit for six weeks, followed by a 25-pound lifting limit for the subsequent six weeks.   I discussed the planned procedure at length with the patient, including the risks, benefits, alternatives, and indications. The risks discussed include but are not limited to bleeding, infection, need for reoperation, spinal fluid leak, stroke, vision loss, anesthetic  complication, coma, paralysis, and even death. I also described in detail that improvement was not guaranteed.  The patient expressed understanding of these risks, and asked that we proceed with surgery. I described the surgery in layman's terms, and gave ample opportunity for questions, which were answered to the best of my ability.     Thank you for involving me in the care of this patient.      Lamount Bankson K. Myer Haff MD, [facility] Neurosurgery

## 2023-04-04 ENCOUNTER — Ambulatory Visit (INDEPENDENT_AMBULATORY_CARE_PROVIDER_SITE_OTHER): Payer: Self-pay | Admitting: Neurosurgery

## 2023-04-04 ENCOUNTER — Encounter: Payer: Self-pay | Admitting: Neurosurgery

## 2023-04-04 VITALS — BP 130/70 | Ht 71.0 in | Wt 228.2 lb

## 2023-04-04 DIAGNOSIS — M5412 Radiculopathy, cervical region: Secondary | ICD-10-CM

## 2023-04-04 DIAGNOSIS — M50123 Cervical disc disorder at C6-C7 level with radiculopathy: Secondary | ICD-10-CM

## 2023-04-04 NOTE — Addendum Note (Signed)
Addended by: Sharlot Gowda on: 04/04/2023 07:41 PM   Modules accepted: Orders

## 2023-04-08 ENCOUNTER — Telehealth: Payer: Self-pay

## 2023-04-08 NOTE — Telephone Encounter (Signed)
I spoke with Trevor Alvarado regarding a self pay estimate for surgery. He would like to discuss this with his wife.     We also discussed his allergy list at length (it previously stated penicillin, with no reaction and cephalexin: "bells palsy").  He reports he took Cephalexin in 2018 and was in the ER the next day b/c they thought he had a stroke. They switched his meds and he was fine after. Upon review of his record, he was also given Ancef in the ER that day.  Penicillin: he reports that he tested positive on an allergy test when he was 44 or 52 years old  He states he would prefer to avoid penicillin and cephalexin or anything related to either one because he believes this is what caused his bell's palsy in 2018. Because has requested not to receive either medication in the future, I have added Ancef to his allergy list.

## 2023-04-12 ENCOUNTER — Encounter: Payer: Self-pay | Admitting: Neurosurgery

## 2023-04-15 ENCOUNTER — Telehealth: Payer: Self-pay

## 2023-04-15 NOTE — Telephone Encounter (Signed)
Mr Novello sent a Earleen Reaper message requesting to schedule surgery for 05/08/23. Sent mychart message to Mr Summer with the following information:  Planned surgery: Left C6-7 posterior discectomy     Surgery date: 05/08/23 at [facility] (Medical Mall: [address] [place], [place] [address]) - you will find out your arrival time the business day before your surgery.     Pre-op appointment at [facility] Pre-admit Testing: we will call you with a date/time for this. If you are scheduled for an in person appointment, Pre-admit Testing is located on the first floor of the [facility] building, [address] [facility] [address] [facility], [address]. Please bring all prescriptions in the original prescription bottles to your appointment. During this appointment, they will advise you which medications you can take the morning of surgery, and which medications you will need to hold for surgery. Labs (such as blood work, EKG) may be done at your pre-op appointment. You are not required to fast for these labs. Should you need to change your pre-op appointment, please call Pre-admit testing at [phone].        Blood thinners:    Aspirin:  stop aspirin 7 days prior, can resume aspirin 14 days after        Surgical clearance: we will send a clearance form to Dr Mayford Knife. If he would like to see you before signing this form, his office will give you a call.         Common restrictions after surgery: No bending, lifting, or twisting ("BLT"). Avoid lifting objects heavier than 10 pounds for the first 6 weeks after surgery. Where possible, avoid household activities that involve lifting, bending, reaching, pushing, or pulling such as laundry, vacuuming, grocery shopping, and childcare. Try to arrange for help from friends and family for these activities while you heal. Do not drive while taking prescription pain medication. Weeks 6 through 12 after surgery: avoid lifting more than 25 pounds.         How to contact us:  If you have any questions/concerns before or after surgery, you can reach [place] at [phone], or you can send a mychart message. We can be reached by phone or mychart 8am-4pm, Monday-Friday.  *Please note: Calls after 4pm are forwarded to a third party answering service. Mychart messages are not routinely monitored during evenings, weekends, and holidays. Please call our office to contact the answering service for urgent concerns during non-business hours.       If you have FMLA/disability paperwork, please drop it off or fax it to [phone], attention Patty.     Appointments/FMLA & disability paperwork: Joycelyn Rua, & Flonnie Hailstone Nurse: Royston Cowper  Medical assistants: Nash Mantis Physician Assistants: Manning Charity & Drake Leach Surgeons: Venetia Night, MD & Ernestine Mcmurray, MD

## 2023-04-16 ENCOUNTER — Other Ambulatory Visit: Payer: Self-pay

## 2023-04-16 DIAGNOSIS — Z01818 Encounter for other preprocedural examination: Secondary | ICD-10-CM

## 2023-04-24 ENCOUNTER — Encounter
Admission: RE | Admit: 2023-04-24 | Discharge: 2023-04-24 | Disposition: A | Discharge status: Home / Self Care | Payer: Self-pay | Source: Ambulatory Visit | Attending: Neurosurgery | Admitting: Neurosurgery

## 2023-04-24 VITALS — BP 120/67 | HR 55 | Resp 14 | Ht 71.0 in | Wt 227.1 lb

## 2023-04-24 DIAGNOSIS — Z01812 Encounter for preprocedural laboratory examination: Secondary | ICD-10-CM

## 2023-04-24 DIAGNOSIS — Z01810 Encounter for preprocedural cardiovascular examination: Secondary | ICD-10-CM

## 2023-04-24 DIAGNOSIS — Z01818 Encounter for other preprocedural examination: Secondary | ICD-10-CM

## 2023-04-24 DIAGNOSIS — E119 Type 2 diabetes mellitus without complications: Secondary | ICD-10-CM

## 2023-04-24 HISTORY — DX: Complete loss of teeth, unspecified cause, unspecified class: Z97.2

## 2023-04-24 HISTORY — DX: Unspecified osteoarthritis, unspecified site: M19.90

## 2023-04-24 HISTORY — DX: Complete traumatic metacarpophalangeal amputation of unspecified finger, initial encounter: S68.119A

## 2023-04-24 HISTORY — DX: Other cervical disc displacement, unspecified cervical region: M50.20

## 2023-04-24 HISTORY — DX: Radiculopathy, cervical region: M54.12

## 2023-04-24 LAB — BASIC METABOLIC PANEL
Anion gap: 11 (ref 5–15)
BUN: 19 mg/dL (ref 6–20)
CO2: 26 mmol/L (ref 22–32)
Calcium: 9.5 mg/dL (ref 8.9–10.3)
Chloride: 101 mmol/L (ref 98–111)
Creatinine, Ser: 1.19 mg/dL (ref 0.61–1.24)
GFR, Estimated: >60 mL/min (ref >60)
Glucose, Bld: 97 mg/dL (ref 70–99)
Potassium: 3.9 mmol/L (ref 3.5–5.1)
Sodium: 138 mmol/L (ref 135–145)

## 2023-04-24 LAB — CBC
HCT: 42 % (ref 39.0–52.0)
Hemoglobin: 14 g/dL (ref 13.0–17.0)
MCH: 28.1 pg (ref 26.0–34.0)
MCHC: 33.3 g/dL (ref 30.0–36.0)
MCV: 84.3 fL (ref 80.0–100.0)
Platelets: 221 10*3/uL (ref 150–400)
RBC: 4.98 MIL/uL (ref 4.22–5.81)
RDW: 12.9 % (ref 11.5–15.5)
WBC: 5.4 10*3/uL (ref 4.0–10.5)
nRBC: 0 % (ref 0.0–0.2)

## 2023-04-24 LAB — TYPE AND SCREEN
ABO/RH(D): O POS
Antibody Screen: NEGATIVE

## 2023-04-24 LAB — SURGICAL PCR SCREEN
MRSA, PCR: NEGATIVE
Staphylococcus aureus: NEGATIVE

## 2023-04-24 LAB — HEMOGLOBIN A1C
Hgb A1c MFr Bld: 5.8 % — ABNORMAL HIGH (ref 4.8–5.6)
Mean Plasma Glucose: 119.76 mg/dL

## 2023-04-24 NOTE — Patient Instructions (Addendum)
Your procedure is scheduled on:05-08-23 Wednesday Report to the Registration Desk on the 1st floor of the Medical Mall.Then proceed to the 2nd floor Surgery Desk To find out your arrival time, please call [phone] between 1PM - 3PM on:05-09-23 Tuesday If your arrival time is 6:00 am, do not arrive before that time as the Medical Mall entrance doors do not open until 6:00 am.  REMEMBER: Instructions that are not followed completely may result in serious medical risk, up to and including death; or upon the discretion of your surgeon and anesthesiologist your surgery may need to be rescheduled.  Do not eat food after midnight the night before surgery.  No gum chewing or hard candies.  You may however, drink WATER up to 2 hours before you are scheduled to arrive for your surgery. Do not drink anything within 2 hours of your scheduled arrival time.  One week prior to surgery:Last dose on 04-30-23 Stop ANY OVER THE COUNTER supplements/vitamins 7 days prior to surgery (Vitamin B12, Magnesium)   Continue taking all prescribed medications with the exception of the following: -Aspirin-Stop 7 days prior to surgery-Last dose will be on 04-30-23 Tuesday  TAKE ONLY THESE MEDICATIONS THE MORNING OF SURGERY WITH A SIP OF WATER: -omeprazole (PRILOSEC)-take one the night before and one on the morning of surgery - helps to prevent nausea after surgery.)  No Alcohol for 24 hours before or after surgery.  No Smoking including e-cigarettes for 24 hours before surgery.  No chewable tobacco products for at least 6 hours before surgery.  No nicotine patches on the day of surgery.  Do not use any "recreational" drugs for at least a week (preferably 2 weeks) before your surgery.  Please be advised that the combination of cocaine and anesthesia may have negative outcomes, up to and including death. If you test positive for cocaine, your surgery will be cancelled.  On the morning of surgery brush your  teeth with toothpaste and water, you may rinse your mouth with mouthwash if you wish. Do not swallow any toothpaste or mouthwash.  Use CHG Soap as directed on instruction sheet.  Do not wear jewelry, make-up, hairpins, clips or nail polish.  For welded (permanent) jewelry: bracelets, anklets, waist bands, etc.  Please have this removed prior to surgery.  If it is not removed, there is a chance that hospital personnel will need to cut it off on the day of surgery.  Do not wear lotions, powders, or perfumes.   Do not shave body hair from the neck down 48 hours before surgery.  Contact lenses, hearing aids and dentures may not be worn into surgery.  Do not bring valuables to the hospital. [facility] is not responsible for any missing/lost belongings or valuables.   Notify your doctor if there is any change in your medical condition (cold, fever, infection).  Wear comfortable clothing (specific to your surgery type) to the hospital.  After surgery, you can help prevent lung complications by doing breathing exercises.  Take deep breaths and cough every 1-2 hours. Your doctor may order a device called an Incentive Spirometer to help you take deep breaths. When coughing or sneezing, hold a pillow firmly against your incision with both hands. This is called "splinting." Doing this helps protect your incision. It also decreases belly discomfort.  If you are being admitted to the hospital overnight, leave your suitcase in the car. After surgery it may be brought to your room.  In case of increased patient census, it  may be necessary for you, the patient, to continue your postoperative care in the Same Day Surgery department.  If you are being discharged the day of surgery, you will not be allowed to drive home. You will need a responsible individual to drive you home and stay with you for 24 hours after surgery.   If you are taking public transportation, you will need to have a responsible  individual with you.  Please call the Pre-admissions Testing Dept. at [phone] if you have any questions about these instructions.  Surgery Visitation Policy:  Patients having surgery or a procedure may have two visitors.  Children under the age of 28 must have an adult with them who is not the patient.    Pre-operative 5 CHG Bath Instructions   You can play a key role in reducing the risk of infection after surgery. Your skin needs to be as free of germs as possible. You can reduce the number of germs on your skin by washing with CHG (chlorhexidine gluconate) soap before surgery. CHG is an antiseptic soap that kills germs and continues to kill germs even after washing.   DO NOT use if you have an allergy to chlorhexidine/CHG or antibacterial soaps. If your skin becomes reddened or irritated, stop using the CHG and notify one of our RNs at [phone].   Please shower with the CHG soap starting 4 days before surgery using the following schedule:     Please keep in mind the following:  DO NOT shave, including legs and underarms, starting the day of your first shower.   You may shave your face at any point before/day of surgery.  Place clean sheets on your bed the day you start using CHG soap. Use a clean washcloth (not used since being washed) for each shower. DO NOT sleep with pets once you start using the CHG.   CHG Shower Instructions:  If you choose to wash your hair and private area, wash first with your normal shampoo/soap.  After you use shampoo/soap, rinse your hair and body thoroughly to remove shampoo/soap residue.  Turn the water OFF and apply about 3 tablespoons (45 ml) of CHG soap to a CLEAN washcloth.  Apply CHG soap ONLY FROM YOUR NECK DOWN TO YOUR TOES (washing for 3-5 minutes)  DO NOT use CHG soap on face, private areas, open wounds, or sores.  Pay special attention to the area where your surgery is being performed.  If you are having back surgery, having  someone wash your back for you may be helpful. Wait 2 minutes after CHG soap is applied, then you may rinse off the CHG soap.  Pat dry with a clean towel  Put on clean clothes/pajamas   If you choose to wear lotion, please use ONLY the CHG-compatible lotions on the back of this paper.     Additional instructions for the day of surgery: DO NOT APPLY any lotions, deodorants, cologne, or perfumes.   Put on clean/comfortable clothes.  Brush your teeth.  Ask your nurse before applying any prescription medications to the skin.      CHG Compatible Lotions   Aveeno Moisturizing lotion  Cetaphil Moisturizing Cream  Cetaphil Moisturizing Lotion  Clairol Herbal Essence Moisturizing Lotion, Dry Skin  Clairol Herbal Essence Moisturizing Lotion, Extra Dry Skin  Clairol Herbal Essence Moisturizing Lotion, Normal Skin  Curel Age Defying Therapeutic Moisturizing Lotion with Alpha Hydroxy  Curel Extreme Care Body Lotion  Curel Soothing Hands Moisturizing Hand Lotion  Curel Therapeutic  Moisturizing Cream, Fragrance-Free  Curel Therapeutic Moisturizing Lotion, Fragrance-Free  Curel Therapeutic Moisturizing Lotion, Original Formula  Eucerin Daily Replenishing Lotion  Eucerin Dry Skin Therapy Plus Alpha Hydroxy Crme  Eucerin Dry Skin Therapy Plus Alpha Hydroxy Lotion  Eucerin Original Crme  Eucerin Original Lotion  Eucerin Plus Crme Eucerin Plus Lotion  Eucerin TriLipid Replenishing Lotion  Keri Anti-Bacterial Hand Lotion  Keri Deep Conditioning Original Lotion Dry Skin Formula Softly Scented  Keri Deep Conditioning Original Lotion, Fragrance Free Sensitive Skin Formula  Keri Lotion Fast Absorbing Fragrance Free Sensitive Skin Formula  Keri Lotion Fast Absorbing Softly Scented Dry Skin Formula  Keri Original Lotion  Keri Skin Renewal Lotion Keri Silky Smooth Lotion  Keri Silky Smooth Sensitive Skin Lotion  Nivea Body Creamy Conditioning Oil  Nivea Body Extra Enriched Gaffer Moisturizing Lotion Nivea Crme  Nivea Skin Firming Lotion  NutraDerm 30 Skin Lotion  NutraDerm Skin Lotion  NutraDerm Therapeutic Skin Cream  NutraDerm Therapeutic Skin Lotion  ProShield Protective Hand Cream  Provon moisturizing lotion

## 2023-04-29 ENCOUNTER — Inpatient Hospital Stay: Admission: RE | Admit: 2023-04-29 | Payer: Self-pay | Source: Ambulatory Visit

## 2023-05-07 MED ORDER — VANCOMYCIN HCL IN DEXTROSE 1-5 GM/200ML-% IV SOLN
1000.0000 mg | Freq: Once | INTRAVENOUS | Status: AC
  Administered 2023-05-08: 1000 mg via INTRAVENOUS

## 2023-05-07 MED ORDER — SODIUM CHLORIDE 0.9 % IV SOLN: INTRAVENOUS | Status: DC

## 2023-05-07 MED ORDER — CHLORHEXIDINE GLUCONATE 0.12 % MT SOLN
15.0000 mL | Freq: Once | OROMUCOSAL | Status: AC
  Administered 2023-05-08: 15 mL via OROMUCOSAL

## 2023-05-07 MED ORDER — ORAL CARE MOUTH RINSE: 15.0000 mL | Freq: Once | OROMUCOSAL | Status: AC

## 2023-05-07 NOTE — Plan of Care (Signed)
CHL Tonsillectomy/Adenoidectomy, Postoperative PEDS care plan entered in error.

## 2023-05-08 ENCOUNTER — Other Ambulatory Visit: Payer: Self-pay

## 2023-05-08 ENCOUNTER — Ambulatory Visit
Admission: RE | Admit: 2023-05-08 | Discharge: 2023-05-08 | Disposition: A | Discharge status: Home / Self Care | Payer: Self-pay | Source: Ambulatory Visit | Attending: Neurosurgery | Admitting: Neurosurgery

## 2023-05-08 ENCOUNTER — Encounter: Payer: Self-pay | Admitting: Neurosurgery

## 2023-05-08 ENCOUNTER — Ambulatory Visit: Payer: Self-pay

## 2023-05-08 ENCOUNTER — Ambulatory Visit: Payer: Self-pay | Admitting: Urgent Care

## 2023-05-08 ENCOUNTER — Encounter
Admission: RE | Disposition: A | Discharge status: Home / Self Care | Payer: Self-pay | Source: Ambulatory Visit | Attending: Neurosurgery

## 2023-05-08 ENCOUNTER — Ambulatory Visit: Payer: Self-pay | Admitting: Certified Registered"

## 2023-05-08 DIAGNOSIS — K219 Gastro-esophageal reflux disease without esophagitis: Secondary | ICD-10-CM | POA: Insufficient documentation

## 2023-05-08 DIAGNOSIS — M5412 Radiculopathy, cervical region: Secondary | ICD-10-CM

## 2023-05-08 DIAGNOSIS — Z87891 Personal history of nicotine dependence: Secondary | ICD-10-CM | POA: Insufficient documentation

## 2023-05-08 DIAGNOSIS — M50123 Cervical disc disorder at C6-C7 level with radiculopathy: Secondary | ICD-10-CM | POA: Insufficient documentation

## 2023-05-08 DIAGNOSIS — Z01818 Encounter for other preprocedural examination: Secondary | ICD-10-CM

## 2023-05-08 HISTORY — PX: POSTERIOR CERVICAL LAMINECTOMY: SHX2248

## 2023-05-08 LAB — ABO/RH: ABO/RH(D): O POS

## 2023-05-08 LAB — GLUCOSE, CAPILLARY
Glucose-Capillary: 96 mg/dL (ref 70–99)
Glucose-Capillary: 125 mg/dL — ABNORMAL HIGH (ref 70–99)

## 2023-05-08 SURGERY — POSTERIOR CERVICAL LAMINECTOMY
Anesthesia: General | Site: Spine Cervical | Laterality: Left

## 2023-05-08 MED ORDER — OXYCODONE HCL 5 MG/5ML PO SOLN: 5.0000 mg | Freq: Once | ORAL | Status: AC | PRN

## 2023-05-08 MED ORDER — BUPIVACAINE-EPINEPHRINE (PF) 0.5% -1:200000 IJ SOLN
INTRAMUSCULAR | Status: AC
  Filled 2023-05-08: qty 10

## 2023-05-08 MED ORDER — REMIFENTANIL HCL 1 MG IV SOLR
INTRAVENOUS | Status: AC
  Filled 2023-05-08: qty 1000

## 2023-05-08 MED ORDER — OXYCODONE HCL 5 MG PO TABS
5.0000 mg | ORAL_TABLET | Freq: Once | ORAL | Status: AC | PRN
  Administered 2023-05-08: 5 mg via ORAL

## 2023-05-08 MED ORDER — MIDAZOLAM HCL 2 MG/2ML IJ SOLN
INTRAMUSCULAR | Status: DC | PRN
  Administered 2023-05-08: 2 mg via INTRAVENOUS

## 2023-05-08 MED ORDER — BUPIVACAINE HCL (PF) 0.5 % IJ SOLN
INTRAMUSCULAR | Status: AC
  Filled 2023-05-08: qty 30

## 2023-05-08 MED ORDER — PROPOFOL 10 MG/ML IV BOLUS
INTRAVENOUS | Status: DC | PRN
  Administered 2023-05-08: 200 mg via INTRAVENOUS

## 2023-05-08 MED ORDER — BUPIVACAINE-EPINEPHRINE (PF) 0.5% -1:200000 IJ SOLN
INTRAMUSCULAR | Status: DC | PRN
  Administered 2023-05-08: 7 mL

## 2023-05-08 MED ORDER — STERILE WATER FOR IRRIGATION IR SOLN
Status: DC | PRN
  Administered 2023-05-08: 500 mL

## 2023-05-08 MED ORDER — PHENYLEPHRINE HCL-NACL 20-0.9 MG/250ML-% IV SOLN
INTRAVENOUS | Status: DC | PRN
  Administered 2023-05-08: 25 ug/min via INTRAVENOUS

## 2023-05-08 MED ORDER — LIDOCAINE HCL (CARDIAC) PF 100 MG/5ML IV SOSY
PREFILLED_SYRINGE | INTRAVENOUS | Status: DC | PRN
  Administered 2023-05-08: 100 mg via INTRAVENOUS

## 2023-05-08 MED ORDER — GLYCOPYRROLATE 0.2 MG/ML IJ SOLN
INTRAMUSCULAR | Status: DC | PRN
  Administered 2023-05-08: .2 mg via INTRAVENOUS

## 2023-05-08 MED ORDER — VANCOMYCIN HCL IN DEXTROSE 1-5 GM/200ML-% IV SOLN
INTRAVENOUS | Status: AC
  Filled 2023-05-08: qty 200

## 2023-05-08 MED ORDER — METHYLPREDNISOLONE ACETATE 40 MG/ML IJ SUSP
INTRAMUSCULAR | Status: AC
  Filled 2023-05-08: qty 1

## 2023-05-08 MED ORDER — HYDROMORPHONE HCL 1 MG/ML IJ SOLN
INTRAMUSCULAR | Status: AC
  Filled 2023-05-08: qty 1

## 2023-05-08 MED ORDER — SENNA 8.6 MG PO TABS: 1.0000 | ORAL_TABLET | Freq: Every day | ORAL | 0 refills | Status: DC | PRN

## 2023-05-08 MED ORDER — FENTANYL CITRATE (PF) 100 MCG/2ML IJ SOLN
INTRAMUSCULAR | Status: AC
  Filled 2023-05-08: qty 2

## 2023-05-08 MED ORDER — OXYCODONE HCL 5 MG PO TABS: 5.0000 mg | ORAL_TABLET | ORAL | 0 refills | Status: AC | PRN

## 2023-05-08 MED ORDER — LACTATED RINGERS IV SOLN: INTRAVENOUS | Status: DC | PRN

## 2023-05-08 MED ORDER — DEXAMETHASONE SODIUM PHOSPHATE 10 MG/ML IJ SOLN
INTRAMUSCULAR | Status: DC | PRN
  Administered 2023-05-08: 10 mg via INTRAVENOUS

## 2023-05-08 MED ORDER — METHYLPREDNISOLONE ACETATE 40 MG/ML IJ SUSP
INTRAMUSCULAR | Status: DC | PRN
  Administered 2023-05-08: 40 mg

## 2023-05-08 MED ORDER — SODIUM CHLORIDE (PF) 0.9 % IJ SOLN
INTRAMUSCULAR | Status: DC | PRN
  Administered 2023-05-08: 60 mL via INTRAMUSCULAR

## 2023-05-08 MED ORDER — FENTANYL CITRATE (PF) 100 MCG/2ML IJ SOLN: 25.0000 ug | INTRAMUSCULAR | Status: DC | PRN

## 2023-05-08 MED ORDER — SUCCINYLCHOLINE CHLORIDE 200 MG/10ML IV SOSY
PREFILLED_SYRINGE | INTRAVENOUS | Status: DC | PRN
  Administered 2023-05-08: 100 mg via INTRAVENOUS

## 2023-05-08 MED ORDER — METHOCARBAMOL 500 MG PO TABS: 500.0000 mg | ORAL_TABLET | Freq: Four times a day (QID) | ORAL | 0 refills | Status: DC | PRN

## 2023-05-08 MED ORDER — SODIUM CHLORIDE FLUSH 0.9 % IV SOLN
INTRAVENOUS | Status: AC
  Filled 2023-05-08: qty 20

## 2023-05-08 MED ORDER — BUPIVACAINE LIPOSOME 1.3 % IJ SUSP
INTRAMUSCULAR | Status: AC
  Filled 2023-05-08: qty 20

## 2023-05-08 MED ORDER — PHENYLEPHRINE 80 MCG/ML (10ML) SYRINGE FOR IV PUSH (FOR BLOOD PRESSURE SUPPORT)
PREFILLED_SYRINGE | INTRAVENOUS | Status: DC | PRN
  Administered 2023-05-08: 160 ug via INTRAVENOUS
  Administered 2023-05-08: 80 ug via INTRAVENOUS

## 2023-05-08 MED ORDER — ACETAMINOPHEN 10 MG/ML IV SOLN
INTRAVENOUS | Status: DC | PRN
  Administered 2023-05-08: 1000 mg via INTRAVENOUS

## 2023-05-08 MED ORDER — 0.9 % SODIUM CHLORIDE (POUR BTL) OPTIME
TOPICAL | Status: DC | PRN
  Administered 2023-05-08: 500 mL

## 2023-05-08 MED ORDER — HYDROMORPHONE HCL 1 MG/ML IJ SOLN
INTRAMUSCULAR | Status: DC | PRN
  Administered 2023-05-08: 1 mg via INTRAVENOUS

## 2023-05-08 MED ORDER — FENTANYL CITRATE (PF) 100 MCG/2ML IJ SOLN
INTRAMUSCULAR | Status: DC | PRN
  Administered 2023-05-08: 50 ug via INTRAVENOUS

## 2023-05-08 MED ORDER — OXYCODONE HCL 5 MG PO TABS
ORAL_TABLET | ORAL | Status: AC
  Filled 2023-05-08: qty 1

## 2023-05-08 MED ORDER — CHLORHEXIDINE GLUCONATE 0.12 % MT SOLN
OROMUCOSAL | Status: AC
  Filled 2023-05-08: qty 15

## 2023-05-08 MED ORDER — MIDAZOLAM HCL 2 MG/2ML IJ SOLN
INTRAMUSCULAR | Status: AC
  Filled 2023-05-08: qty 2

## 2023-05-08 MED ORDER — SURGIFLO WITH THROMBIN (HEMOSTATIC MATRIX KIT) OPTIME
TOPICAL | Status: DC | PRN
  Administered 2023-05-08: 1 via TOPICAL

## 2023-05-08 MED ORDER — ONDANSETRON HCL 4 MG/2ML IJ SOLN
INTRAMUSCULAR | Status: DC | PRN
  Administered 2023-05-08 (×2): 4 mg via INTRAVENOUS

## 2023-05-08 MED ORDER — REMIFENTANIL HCL 1 MG IV SOLR
INTRAVENOUS | Status: DC | PRN
Start: 2023-05-08 — End: 2023-05-08
  Administered 2023-05-08: .1 ug/kg/min via INTRAVENOUS

## 2023-05-08 SURGICAL SUPPLY — 56 items
ADH SKN CLS APL DERMABOND .7 (GAUZE/BANDAGES/DRESSINGS) ×1
AGENT HMST KT MTR STRL THRMB (HEMOSTASIS) ×1
BASIN KIT SINGLE STR (MISCELLANEOUS) ×1 IMPLANT
BUR NEURO DRILL SOFT 3.0X3.8M (BURR) ×1 IMPLANT
CORD BIP STRL DISP 12FT (MISCELLANEOUS) IMPLANT
DERMABOND ADVANCED .7 DNX12 (GAUZE/BANDAGES/DRESSINGS) ×1 IMPLANT
DRAPE C ARM PK CFD 31 SPINE (DRAPES) ×1 IMPLANT
DRAPE INCISE IOBAN 66X45 STRL (DRAPES) IMPLANT
DRAPE LAPAROTOMY 100X77 ABD (DRAPES) ×1 IMPLANT
DRAPE MICROSCOPE SPINE 48X150 (DRAPES) IMPLANT
DRSG OPSITE POSTOP 3X4 (GAUZE/BANDAGES/DRESSINGS) IMPLANT
ELECT BLADE 4.0 EZ CLEAN MEGAD (MISCELLANEOUS)
ELECT CAUTERY BLADE 6.4 (BLADE) IMPLANT
ELECT EZSTD 165MM 6.5IN (MISCELLANEOUS) ×1
ELECTRODE BLDE 4.0 EZ CLN MEGD (MISCELLANEOUS) IMPLANT
ELECTRODE EZSTD 165MM 6.5IN (MISCELLANEOUS) IMPLANT
EVACUATOR 1/8 PVC DRAIN (DRAIN) ×1 IMPLANT
FEE INTRAOP CADWELL SUPPLY NCS (MISCELLANEOUS) IMPLANT
FEE INTRAOP MONITOR IMPULS NCS (MISCELLANEOUS) IMPLANT
GLOVE BIOGEL PI IND STRL 6.5 (GLOVE) ×1 IMPLANT
GLOVE SURG SYN 6.5 ES PF (GLOVE) ×1 IMPLANT
GLOVE SURG SYN 6.5 PF PI (GLOVE) ×1 IMPLANT
GLOVE SURG SYN 8.5 E (GLOVE) ×3 IMPLANT
GLOVE SURG SYN 8.5 PF PI (GLOVE) ×3 IMPLANT
GOWN SRG LRG LVL 4 IMPRV REINF (GOWNS) ×1 IMPLANT
GOWN SRG XL LVL 3 NONREINFORCE (GOWNS) ×1 IMPLANT
GOWN STRL NON-REIN TWL XL LVL3 (GOWNS) ×1
GOWN STRL REIN LRG LVL4 (GOWNS) ×1
HOLDER FOLEY CATH W/STRAP (MISCELLANEOUS) ×1 IMPLANT
INTRAOP CADWELL SUPPLY FEE NCS (MISCELLANEOUS)
INTRAOP MONITOR FEE IMPULS NCS (MISCELLANEOUS)
JET LAVAGE IRRISEPT WOUND (IRRIGATION / IRRIGATOR)
KIT PREVENA INCISION MGT 13 (CANNISTER) ×1 IMPLANT
KIT SPINAL PRONEVIEW (KITS) ×1 IMPLANT
LAVAGE JET IRRISEPT WOUND (IRRIGATION / IRRIGATOR) ×1 IMPLANT
MANIFOLD NEPTUNE II (INSTRUMENTS) ×1 IMPLANT
MARKER SKIN DUAL TIP RULER LAB (MISCELLANEOUS) ×1 IMPLANT
NDL SAFETY ECLIPSE 18X1.5 (NEEDLE) IMPLANT
NS IRRIG 1000ML POUR BTL (IV SOLUTION) ×1 IMPLANT
NS IRRIG 500ML POUR BTL (IV SOLUTION) IMPLANT
PACK BASIN MINOR ARMC (MISCELLANEOUS) IMPLANT
PACK LAMINECTOMY ARMC (PACKS) ×1 IMPLANT
PAD ARMBOARD 7.5X6 YLW CONV (MISCELLANEOUS) ×1 IMPLANT
STAPLER SKIN PROX 35W (STAPLE) ×2 IMPLANT
SURGIFLO W/THROMBIN 8M KIT (HEMOSTASIS) ×1 IMPLANT
SUT ETHILON 3-0 FS-10 30 BLK (SUTURE)
SUT V-LOC 90 ABS DVC 3-0 CL (SUTURE) IMPLANT
SUT VIC AB 0 CT1 27 (SUTURE) ×1
SUT VIC AB 0 CT1 27XCR 8 STRN (SUTURE) ×3 IMPLANT
SUT VIC AB 2-0 CT1 18 (SUTURE) ×2 IMPLANT
SUTURE EHLN 3-0 FS-10 30 BLK (SUTURE) IMPLANT
SYR 30ML LL (SYRINGE) ×2 IMPLANT
TAPE CLOTH 3X10 WHT NS LF (GAUZE/BANDAGES/DRESSINGS) ×2 IMPLANT
TRAP FLUID SMOKE EVACUATOR (MISCELLANEOUS) ×1 IMPLANT
TRAY FOLEY SLVR 16FR LF STAT (SET/KITS/TRAYS/PACK) ×1 IMPLANT
WATER STERILE IRR 500ML POUR (IV SOLUTION) IMPLANT

## 2023-05-08 NOTE — Op Note (Signed)
Indications: Trevor Alvarado is a 52 y.o. male with cervical radiculopathy M54.12.   Findings: disc herniation, removed   Preoperative Diagnosis: Cervical radiculopathy Postoperative Diagnosis: same   EBL: 10 ml IVF: see anesthesia record Drains: none Disposition: Extubated and Stable to PACU Complications: none  No foley catheter was placed.   Preoperative Note:    Risks of surgery discussed include: infection, bleeding, stroke, coma, death, paralysis, CSF leak, nerve/spinal cord injury, numbness, tingling, weakness, complex regional pain syndrome, recurrent stenosis and/or disc herniation, vascular injury, development of instability, neck/back pain, need for further surgery, persistent symptoms, development of deformity, and the risks of anesthesia. The patient understood these risks and agreed to proceed.  Procedure:  1) Posterior cervical discectomy fusion at C6-7 on left   Procedure: After obtaining informed consent, the patient taken to the operating room, placed in supine position, general anesthesia induced.  The patient was positioned in the prone position.  Fluoroscopy was used to mark the incision.  The patient was then prepped and draped in standard fashion.  A full timeout was performed.  Antibiotics were given.  Incision was opened sharply.  Using electrocautery, the soft tissue's were divided.  On the ipsilateral side, the paraspinous muscles were reflected and subperiosteal fashion until the lamina and facet joint were visible.  Fluoroscopy was used to confirm localization at C6/7 on the left side.  The microscope was utilized for microdissection.  After confirmation of localization, a laminoforaminotomy was performed using a high-speed drill.  The ligamentum flavum was identified and removed until the dura was identified.  The shoulder of the exiting C7 nerve root was identified.  The disc herniation was located and dissected free.  It was lightly coagulated, then  entered.  The disc fragment was dissected free and removed in piecemeal fashion until no further compression of the nerve root was identified.  Medial facetectomy was performed using a 1 mm punch to ensure no residual compression.  Once the nerve root had been completely decompressed, the operative site was irrigated and hemostasis achieved.  The microscope was then removed from the field.  We then closed the incision using 0 and 2-0 Vicryl sutures with 3-0 Monocryl on the skin.  Dermabond was used as a dressing.  The patient was then handed back to anesthesia for extubation.  Sponge and pattie counts were correct at the end of the procedure.    I performed the entire procedure with Manning Charity PA as an Designer, television/film set. An assistant was required for this procedure due to the complexity.  The assistant provided assistance in tissue manipulation and suction, and was required for the successful and safe performance of the procedure. I performed the critical portions of the procedure.   Venetia Night MD

## 2023-05-08 NOTE — Anesthesia Preprocedure Evaluation (Signed)
Anesthesia Evaluation  Patient identified by MRN, date of birth, ID band Patient awake    Reviewed: Allergy & Precautions, NPO status , Patient's Chart, lab work & pertinent test results  Airway Mallampati: III  TM Distance: >3 FB Neck ROM: full    Dental  (+) Edentulous Upper, Dental Advidsory Given, Edentulous Lower   Pulmonary neg pulmonary ROS, former smoker   Pulmonary exam normal        Cardiovascular negative cardio ROS Normal cardiovascular exam     Neuro/Psych negative neurological ROS  negative psych ROS   GI/Hepatic Neg liver ROS,GERD  Medicated and Controlled,,  Endo/Other  negative endocrine ROSdiabetes    Renal/GU      Musculoskeletal   Abdominal   Peds  Hematology negative hematology ROS (+)   Anesthesia Other Findings Past Medical History: No date: Arthritis No date: Asthma     Comment:  as a child-no inhalers No date: Cervical disc herniation No date: Cervical radiculopathy at C6 No date: Diabetes mellitus without complication (HCC)     Comment:  lost weight and was able to come off metformin dm is now              controlled per pt No date: Full dentures No date: GERD (gastroesophageal reflux disease) No date: 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  Past Surgical History: No date: FINGER SURGERY; Right     Comment:  ring finger No date: TONSILLECTOMY No date: VASECTOMY  BMI    Body Mass Index: 31.67 kg/m      Reproductive/Obstetrics negative OB ROS                             Anesthesia Physical Anesthesia Plan  ASA: 2  Anesthesia Plan: General ETT and General   Post-op Pain Management:    Induction: Intravenous  PONV Risk Score and Plan: 2 and Ondansetron, Dexamethasone and Midazolam  Airway Management Planned: Oral ETT  Additional Equipment:   Intra-op Plan:   Post-operative Plan: Extubation in OR  Informed Consent: I have  reviewed the patients History and Physical, chart, labs and discussed the procedure including the risks, benefits and alternatives for the proposed anesthesia with the patient or authorized representative who has indicated his/her understanding and acceptance.     Dental Advisory Given  Plan Discussed with: Anesthesiologist, CRNA and Surgeon  Anesthesia Plan Comments: (Patient consented for risks of anesthesia including but not limited to:  - adverse reactions to medications - damage to eyes, teeth, lips or other oral mucosa - nerve damage due to positioning  - sore throat or hoarseness - Damage to heart, brain, nerves, lungs, other parts of body or loss of life  Patient voiced understanding and assent.)       Anesthesia Quick Evaluation

## 2023-05-08 NOTE — Anesthesia Procedure Notes (Signed)
Procedure Name: Intubation Date/Time: 05/08/2023 9:40 AM  Performed by: Mohammed Kindle, CRNAPre-anesthesia Checklist: Patient identified, Emergency Drugs available, Suction available and Patient being monitored Patient Re-evaluated:Patient Re-evaluated prior to induction Oxygen Delivery Method: Circle system utilized Preoxygenation: Pre-oxygenation with 100% oxygen Induction Type: IV induction Ventilation: Mask ventilation without difficulty Laryngoscope Size: McGraph and 3 Grade View: Grade I Tube type: Oral Number of attempts: 1 Airway Equipment and Method: Stylet Placement Confirmation: ETT inserted through vocal cords under direct vision, positive ETCO2, breath sounds checked- equal and bilateral and CO2 detector Secured at: 21 cm Tube secured with: Tape Dental Injury: Teeth and Oropharynx as per pre-operative assessment

## 2023-05-08 NOTE — H&P (Signed)
Referring Physician:  Venetia Night, MD [address] [place],  [place] [address]  Primary Physician:  Emily Filbert, MD  History of Present Illness: 05/08/2023 Trevor Alvarado presents today with continued pain.    04/03/2023 Trevor Alvarado is here today with a chief complaint of neck and left arm pain.  He works as both a Hotel manager.  He presents with severe neck pain that radiates down his arm and causes numbness and tingling in his fingers. The pain has been ongoing for a while and has been affecting his ability to perform daily activities and work. He describes the pain as sharp and shooting, which is exacerbated by certain movements and activities. He has tried to manage the pain with exercises given by a chiropractor and by adjusting his sleeping position, which has resulted in improved sleep duration from four to five hours to seven to eight hours per night. Despite these efforts, the pain persists and has led to significant weakness in his arm, particularly noticeable when lifting weights, a decrease from being able to lift 20-25 pounds to only 5 pounds. He has also noticed a loss of muscle mass in his triceps. He has recently quit using nicotine.  He has been having pain for at least 6 months.  Bowel/Bladder Dysfunction: none  Conservative measures: seen a chiropractor: 6 months ago  Physical therapy:  has not participated in Multimodal medical therapy including regular antiinflammatories:  tramadol, prednisone, flexeril  Injections:  has not received epidural steroid injections  Past Surgery: no previous spinal surgery  Trevor Alvarado has no symptoms of cervical myelopathy.  The symptoms are causing a significant impact on the patient's life.   I have utilized the care everywhere function in epic to review the outside records available from external health systems.  Review of Systems:  A 10 point review of systems is negative,  except for the pertinent positives and negatives detailed in the HPI.  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     Past Surgical History: Past Surgical History:  Procedure Laterality Date   FINGER SURGERY Right    ring finger   TONSILLECTOMY     VASECTOMY      Allergies: Allergies as of 04/16/2023 - Review Complete 04/04/2023  Allergen Reaction Noted   Cephalexin Other (See Comments) 02/08/2017   Penicillins Other (See Comments) and Anaphylaxis 12/22/2016   Ancef [cefazolin] Other (See Comments) 02/08/2017    Medications:  Current Facility-Administered Medications:    0.9 %  sodium chloride infusion, , Intravenous, Continuous, Adams, Currie Paris, MD   vancomycin (VANCOCIN) IVPB 1000 mg/200 mL premix, 1,000 mg, Intravenous, Once, Venetia Night, MD, Last Rate: 200 mL/hr at 05/08/23 0904, 1,000 mg at 05/08/23 4098  Social History: Social History   Tobacco Use   Smoking status: Former    Types: Cigarettes   Smokeless tobacco: Current    Types: Snuff  Vaping Use   Vaping status: Some Days   Substances: Nicotine, Flavoring  Substance Use Topics   Alcohol  use: No   Drug use: No    Family Medical History: History reviewed. No pertinent family history.  Physical Examination: Vitals:   05/08/23 0810 05/08/23 0811  BP:  133/86  Pulse:  63  Resp: 14 14  Temp:  97.6 F (36.4 C)  SpO2: 98% 98%   Heart sounds normal no MRG. Chest Clear to Auscultation Bilaterally.  General: Patient is in no apparent distress. Attention to examination is appropriate.  Neck:   Supple.  Full range of motion. Bending to  R makes his pain better.  Respiratory: Patient is breathing without any difficulty.   NEUROLOGICAL:     Awake, alert, oriented to person, place, and time.  Speech is clear and fluent.   Cranial Nerves: Pupils equal round and reactive to light.  Facial tone is symmetric.  Facial sensation is symmetric. Shoulder shrug is symmetric. Tongue protrusion is midline.  There is no pronator drift.  Strength: Side Biceps Triceps Deltoid Interossei Grip Wrist Ext. Wrist Flex.  R 5 5 5 5 5 5 5   L 5 4- 5 5 5 5 5    Side Iliopsoas Quads Hamstring PF DF EHL  R 5 5 5 5 5 5   L 5 5 5 5 5 5    Reflexes are 1+ and symmetric at the biceps, triceps, brachioradialis, patella and achilles.   Hoffman's is absent.   Bilateral upper and lower extremity sensation is intact to light touch.    No evidence of dysmetria noted.  Gait is normal.     Medical Decision Making  Imaging: MRI C spine 03/09/2023 Moderate-sized broad-based central and left paracentral and medial foraminal disc protrusion at C6-7 with significant mass effect on the left C7 nerve root in the medial neuroforamen.  Shallow right paracentral disc protrusion at C5-6 with mild mass effect on the right side of the thecal sac and mild right medial foraminal encroachment.  I have personally reviewed the images and agree with the above interpretation.  Assessment and Plan: Trevor Alvarado is a pleasant 52 y.o. 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  He has tried and failed conservative management.  He has persistent objective weakness.  We will proceed with left C6-7 discectomy.     Laray Corbit K. Myer Haff MD, [facility]

## 2023-05-08 NOTE — Anesthesia Postprocedure Evaluation (Signed)
Anesthesia Post Note  Patient: Trevor Alvarado  Procedure(s) Performed: LEFT C6-7 POSTERIOR CERVICAL DISCECTOMY (Left: Spine Cervical)  Patient location during evaluation: PACU Anesthesia Type: General Level of consciousness: awake and alert Pain management: pain level controlled Vital Signs Assessment: post-procedure vital signs reviewed and stable Respiratory status: spontaneous breathing, nonlabored ventilation, respiratory function stable and patient connected to nasal cannula oxygen Cardiovascular status: blood pressure returned to baseline and stable Postop Assessment: no apparent nausea or vomiting Anesthetic complications: no  No notable events documented.   Last Vitals:  Vitals:   05/08/23 1210 05/08/23 1250  BP: 129/66 129/74  Pulse: (!) 55 68  Resp: 20 (!) 22  Temp: 37 C   SpO2: 98% 100%    Last Pain:  Vitals:   05/08/23 1210  TempSrc: Temporal  PainSc: 0-No pain                 Stephanie Coup

## 2023-05-08 NOTE — Progress Notes (Signed)
Patient able to ambulate to and from the restroom with no difficulty

## 2023-05-08 NOTE — Discharge Instructions (Addendum)
Your surgeon has performed an operation on your cervical spine (neck) to relieve pressure on the spinal cord and/or nerves.   The following are instructions to help in your recovery once you have been discharged from the hospital. Even if you feel well, it is important that you follow these activity guidelines. If you do not let your neck heal properly from the surgery, you can increase the chance of return of your symptoms and other complications.  * Please resume home Aspirin as instructed in pre-op paperwork  Activity    No bending, lifting, or twisting ("BLT"). Avoid lifting objects heavier than 10 pounds (gallon milk jug).  Where possible, avoid household activities that involve lifting, bending, reaching, pushing, or pulling such as laundry, vacuuming, grocery shopping, and childcare. Try to arrange for help from friends and family for these activities while your back heals.  Increase physical activity slowly as tolerated.  Taking short walks is encouraged, but avoid strenuous exercise. Do not jog, run, bicycle, lift weights, or participate in any other exercises unless specifically allowed by your doctor.  Talk to your doctor before resuming sexual activity.  You should not drive until cleared by your doctor.  Until released by your doctor, you should not return to work or school.  You should rest at home and let your body heal.   You may shower three days after your surgery.  After showering, lightly dab your incision dry. Do not take a tub bath or go swimming until approved by your doctor at your follow-up appointment.  If your doctor ordered a cervical collar (neck brace) for you, you should wear it whenever you are out of bed. You may remove it when lying down or sleeping, but you should wear it at all other times. Not all neck surgeries require a cervical collar.  If you smoke, we strongly recommend that you quit.  Smoking has been proven to interfere with normal bone healing and  will dramatically reduce the success rate of your surgery. Please contact [facility] (800-QUIT-NOW) and use the resources at www.[facility].com for assistance in stopping smoking.  Surgical Incision   If you have a dressing on your incision, you may remove it two days after your surgery. Keep your incision area clean and dry.  If you have staples or stitches on your incision, you should have a follow up scheduled for removal. If you do not have staples or stitches, you will have steri-strips (small pieces of surgical tape) or Dermabond glue. The steri-strips/glue should begin to peel away within about a week (it is fine if the steri-strips fall off before then). If the strips are still in place one week after your surgery, you may gently remove them.  Diet           You may return to your usual diet. However, you may experience discomfort when swallowing in the first month after your surgery. This is normal. You may find that softer foods are more comfortable for you to swallow. Be sure to stay hydrated.  When to Contact us  You may experience pain in your neck and/or pain between your shoulder blades. This is normal and should improve in the next few weeks with the help of pain medication, muscle relaxers, and rest. Some patients report that a warm compress on the back of the neck or between the shoulder blades helps.  However, should you experience any of the following, contact us immediately: New numbness or weakness Pain that is progressively getting worse,  and is not relieved by your pain medication, muscle relaxers, rest, and warm compresses Bleeding, redness, swelling, pain, or drainage from surgical incision Chills or flu-like symptoms Fever greater than 101.0 F (38.3 C) Inability to eat, drink fluids, or take medications Problems with bowel or bladder functions Difficulty breathing or shortness of breath Warmth, tenderness, or swelling in your calf Contact Information How to  contact us:  If you have any questions/concerns before or after surgery, you can reach [place] at [phone], or you can send a mychart message. We can be reached by phone or mychart 8am-4pm, Monday-Friday.  *Please note: Calls after 4pm are forwarded to a third party answering service. Mychart messages are not routinely monitored during evenings, weekends, and holidays. Please call our office to contact the answering service for urgent concerns during non-business hours.    AMBULATORY SURGERY  DISCHARGE INSTRUCTIONS   The drugs that you were given will stay in your system until tomorrow so for the next 24 hours you should not:  Drive an automobile Make any legal decisions Drink any alcoholic beverage   You may resume regular meals tomorrow.  Today it is better to start with liquids and gradually work up to solid foods.  You may eat anything you prefer, but it is better to start with liquids, then soup and crackers, and gradually work up to solid foods.   Please notify your doctor immediately if you have any unusual bleeding, trouble breathing, redness and pain at the surgery site, drainage, fever, or pain not relieved by medication.    Additional Instructions:   Please contact your physician with any problems or Same Day Surgery at [phone], Monday through Friday 6 am to 4 pm, or [place] [facility] number at [phone].

## 2023-05-08 NOTE — Discharge Summary (Signed)
Discharge Summary  Patient ID: Trevor Alvarado MRN: [id number] DOB/AGE: 1971/06/30 52 y.o.  Admit date: 05/08/2023 Discharge date: 05/08/2023  Admission Diagnoses: Cervical radiculopathy  Discharge Diagnoses:  Active Problems:   * No active hospital problems. *   Discharged Condition: good  Hospital Course:  Trevor Alvarado is a 52 y.o presenting with left radiating arm pain s/p left C6-7 posterior cervical discectomy. His intraoperative course was uncomplicated. ***  Consults: None  Significant Diagnostic Studies: none  Treatments: surgery: as above. Please see separately dictated operative report for further details   Discharge Exam: Blood pressure 133/86, pulse 63, temperature 97.6 F (36.4 C), temperature source Oral, resp. rate 14, height 5\' 11"  (1.803 m), weight 103 kg, SpO2 98%. {physical [id number]}  Disposition: Discharge disposition: 01-Home or Self Care        Allergies as of 05/08/2023       Reactions   Cephalexin Other (See Comments)   Bells palsy (right side facial paralysis) in 2018, cleared up when meds were changed Bell's Palsy, facial swelling   Penicillins Other (See Comments), Anaphylaxis   tested positive on an allergy test when he was 36 or 52 years old   Ancef [cefazolin] Other (See Comments)   Pt reports bell's palsy (right side facial paralysis) in 2018, cleared up when meds were changed   Latex Rash        Medication List     STOP taking these medications    aspirin 81 MG chewable tablet       TAKE these medications    B-12 PO Take 1 tablet by mouth daily.   loratadine 10 MG tablet Commonly known as: CLARITIN Take 10 mg by mouth daily as needed for allergies.   MAGNESIUM PO Take 3 tablets by mouth daily.   methocarbamol 500 MG tablet Commonly known as: ROBAXIN Take 1 tablet (500 mg total) by mouth every 6 (six) hours as needed for muscle spasms.   omeprazole 20 MG capsule Commonly known as: PRILOSEC Take 20 mg by  mouth every morning.   oxyCODONE 5 MG immediate release tablet Commonly known as: Roxicodone Take 1 tablet (5 mg total) by mouth every 4 (four) hours as needed for up to 5 days for severe pain (pain score 7-10).   senna 8.6 MG Tabs tablet Commonly known as: SENOKOT Take 1 tablet (8.6 mg total) by mouth daily as needed for mild constipation.   UNABLE TO FIND Take 1 tablet by mouth as needed. CBD gummy        Follow-up Information     Susanne Borders, PA Follow up on 05/21/2023.   Specialty: Neurosurgery Contact information: [address] [place] [address] [phone]                 Signed: Susanne Borders 05/08/2023, 11:16 AM

## 2023-05-08 NOTE — Transfer of Care (Signed)
Immediate Anesthesia Transfer of Care Note  Patient: Trevor Alvarado  Procedure(s) Performed: LEFT C6-7 POSTERIOR CERVICAL DISCECTOMY (Left: Spine Cervical)  Patient Location: PACU  Anesthesia Type:General  Level of Consciousness: drowsy  Airway & Oxygen Therapy: Patient Spontanous Breathing and Patient connected to face mask oxygen  Post-op Assessment: Report given to RN and Post -op Vital signs reviewed and stable  Post vital signs: Reviewed and stable  Last Vitals:  Vitals Value Taken Time  BP 120/53 05/08/23 1122  Temp    Pulse 74 05/08/23 1127  Resp 12 05/08/23 1127  SpO2 100 % 05/08/23 1127  Vitals shown include unfiled device data.  Last Pain:  Vitals:   05/08/23 0811  TempSrc: Oral  PainSc: 6       Patients Stated Pain Goal: 0 (05/08/23 0811)  Complications: No notable events documented.

## 2023-05-09 ENCOUNTER — Encounter: Payer: Self-pay | Admitting: Neurosurgery

## 2023-05-13 ENCOUNTER — Encounter: Payer: Self-pay | Admitting: Neurosurgery

## 2023-05-21 ENCOUNTER — Ambulatory Visit (INDEPENDENT_AMBULATORY_CARE_PROVIDER_SITE_OTHER): Payer: Self-pay | Admitting: Neurosurgery

## 2023-05-21 ENCOUNTER — Encounter: Payer: Self-pay | Admitting: Neurosurgery

## 2023-05-21 VITALS — BP 136/82 | Temp 98.9°F | Ht 71.0 in | Wt 227.0 lb

## 2023-05-21 DIAGNOSIS — M5412 Radiculopathy, cervical region: Secondary | ICD-10-CM

## 2023-05-21 NOTE — Progress Notes (Signed)
   REFERRING PHYSICIAN:  Emily Filbert, Md [address], [address] [place],  [place] [address]  DOS: 05/08/23 left C6-7 posterior discectomy   HISTORY OF PRESENT ILLNESS: Trevor Alvarado is about 2 weeks status post cervical discectomy. Overall, he is doing well post-operatively. He continue to have some numbness and tingling particularly in his hand and fingertips on the left.  He also endorses some pain in his left shoulder particularly since having been jumped on by a dog yesterday.  Overall he feels that he is better than he was preoperatively. He is taking ibuprofen infrequently as needed but is not currently taking any muscle relaxers or pain medication.  PHYSICAL EXAMINATION:  NEUROLOGICAL:  General: In no acute distress.   Awake, alert, oriented to person, place, and time.  Pupils equal round and reactive to light.  Facial tone is symmetric.  Tongue protrusion is midline.  There is no pronator drift.  Strength: Side Biceps Triceps Deltoid Interossei Grip Wrist Ext. Wrist Flex.  R 5 5 5 5 5 5 5   L 5 4+ 5 5 5 5 5    Incision c/d/I and healing well  Imaging:  No interval imaging  Assessment / Plan: Trevor Alvarado is doing well after cervical discectomy.  We discussed that his residual numbness and tingling should continue to improve but can take several months and may not resolve entirely.  I encouraged him to continue to monitor his left shoulder pain.  We discussed activity escalation and I have advised the patient to lift up to 10 pounds until 6 weeks after surgery, then increase up to 25 pounds until 12 weeks after surgery.  After 12 weeks post-op, the patient advised to increase activity as tolerated. he will return to clinic in approximately 4 weeks or sooner should he have any questions or concerns.  He expressed understanding and was in agreement with this plan.  Advised to contact the office if any questions or concerns arise.   Manning Charity PA-C Dept of Neurosurgery

## 2023-06-18 ENCOUNTER — Ambulatory Visit (INDEPENDENT_AMBULATORY_CARE_PROVIDER_SITE_OTHER): Payer: Self-pay | Admitting: Neurosurgery

## 2023-06-18 ENCOUNTER — Encounter: Payer: Self-pay | Admitting: Neurosurgery

## 2023-06-18 VITALS — BP 148/80 | Ht 71.0 in | Wt 227.0 lb

## 2023-06-18 DIAGNOSIS — Z09 Encounter for follow-up examination after completed treatment for conditions other than malignant neoplasm: Secondary | ICD-10-CM

## 2023-06-18 DIAGNOSIS — M5412 Radiculopathy, cervical region: Secondary | ICD-10-CM

## 2023-06-18 NOTE — Progress Notes (Signed)
   REFERRING PHYSICIAN:  Emily Filbert, Md [address], [address] [place],  [place] [address]  DOS: 05/08/23 left C6-7 posterior discectomy   HISTORY OF PRESENT ILLNESS: Trevor Alvarado is status post cervical discectomy. Overall, he is doing well.  His left arm pain is down at least 80%.  He is having intermittent pins-and-needles.   PHYSICAL EXAMINATION:  NEUROLOGICAL:  General: In no acute distress.   Awake, alert, oriented to person, place, and time.  Pupils equal round and reactive to light.  Facial tone is symmetric.  Tongue protrusion is midline.  There is no pronator drift.  Strength: Side Biceps Triceps Deltoid Interossei Grip Wrist Ext. Wrist Flex.  R 5 5 5 5 5 5 5   L 5 5 5 5 5 5 5    Incision c/d/I and healing well  Imaging:  No interval imaging  Assessment / Plan: Trevor Alvarado is doing well after cervical discectomy.  We discussed activity escalation.  I am very pleased with his improvement in symptoms.  Will see him back in approximately 6 weeks.    Venetia Night MD Dept of Neurosurgery

## 2023-07-30 ENCOUNTER — Encounter: Payer: Self-pay | Admitting: Neurosurgery

## 2023-08-01 ENCOUNTER — Ambulatory Visit: Payer: Self-pay | Admitting: Neurosurgery

## 2023-08-01 VITALS — BP 134/80 | Ht 71.0 in | Wt 227.0 lb

## 2023-08-01 DIAGNOSIS — M5412 Radiculopathy, cervical region: Secondary | ICD-10-CM

## 2023-08-01 DIAGNOSIS — Z09 Encounter for follow-up examination after completed treatment for conditions other than malignant neoplasm: Secondary | ICD-10-CM

## 2023-08-01 NOTE — Progress Notes (Signed)
   REFERRING PHYSICIAN:  Emily Filbert, Md [address] [place],  [place] [address]  DOS: 05/08/23 left C6-7 posterior discectomy   HISTORY OF PRESENT ILLNESS:  08/01/23 Trevor Alvarado is a 53 year old presenting after cervical discectomy.  He is about 3 months out from his surgery. Overall he is doing well. He does admit to some intermittent discomfort in his neck and shoulders that radiates on the left side to his shoulder but nothing that radiates down his arm.  This seems to be worse with flexion, leg looking down when he reads his Bible.  Overall it is more of an annoyance and is tolerable.  06/18/23 Joella Prince is status post cervical discectomy. Overall, he is doing well.  His left arm pain is down at least 80%.  He is having intermittent pins-and-needles.   PHYSICAL EXAMINATION:  NEUROLOGICAL:  General: In no acute distress.   Awake, alert, oriented to person, place, and time.  Pupils equal round and reactive to light.  Facial tone is symmetric.  Tongue protrusion is midline.  There is no pronator drift.  Strength: Side Biceps Triceps Deltoid Interossei Grip Wrist Ext. Wrist Flex.  R 5 5 5 5 5 5 5   L 5 5 5 5 5 5 5    Incision well healed   Imaging:  No interval imaging  Assessment / Plan: Jahdiel Corson is doing well after cervical discectomy.  Despite some residual symptoms he is doing well and is pleased with his postoperative recovery thus far.  We discussed further activity escalation and he is cleared to resume activities as tolerated when he is 3 months postop.  He was encouraged to call our office should he have any questions or concerns.  We will otherwise see him going forward on an as-needed basis.  He expressed understanding and was in agreement with this plan.  Manning Charity PA-C Dept of Neurosurgery
